# Patient Record
Sex: Male | Born: 1970 | Race: Black or African American | Hispanic: No | Smoking: Current some day smoker
Health system: Southern US, Community
[De-identification: ages and names within clinical notes are randomized; demographics above are authoritative.]

## PROBLEM LIST (undated history)

## (undated) HISTORY — PX: OTHER SURGICAL HISTORY: SHX169

---

## 2005-03-20 ENCOUNTER — Emergency Department (HOSPITAL_COMMUNITY): Admission: EM | Admit: 2005-03-20 | Discharge: 2005-03-20 | Payer: Self-pay | Admitting: Emergency Medicine

## 2007-05-15 IMAGING — CR DG HAND COMPLETE 3+V*R*
3 series · 3 of 3 positions shown · non-contrast
Comparison: none

CLINICAL DATA: Swelling of the medial hand.
 RIGHT HAND ? 3 VIEW ? 03/20/05: 
 No prior studies.

[view not recorded (1 of 3)]
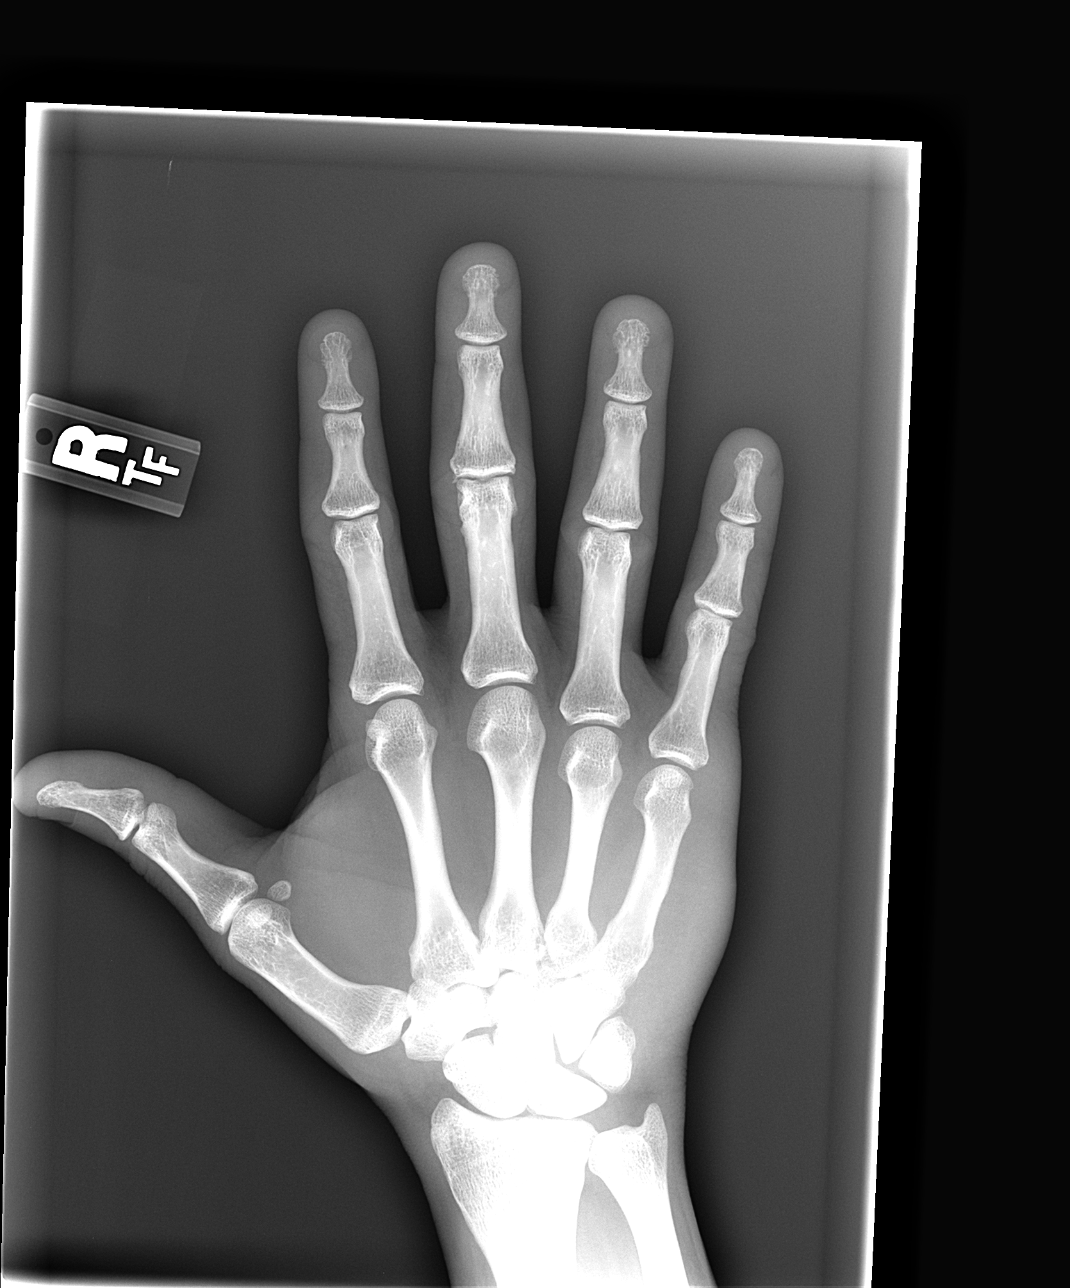

[view not recorded (2 of 3)]
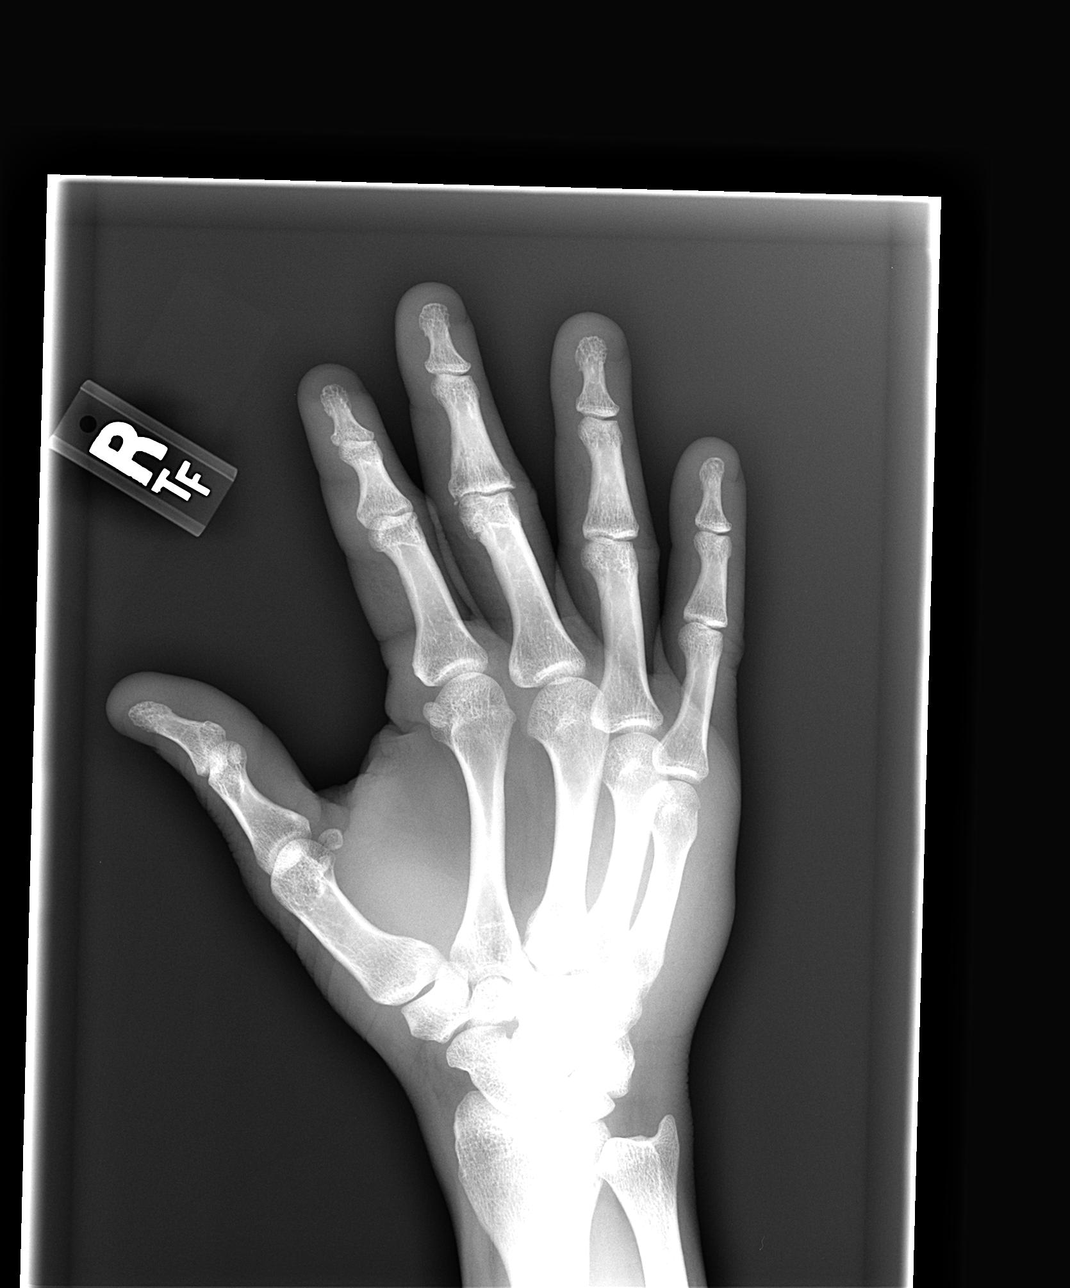

[view not recorded (3 of 3)]
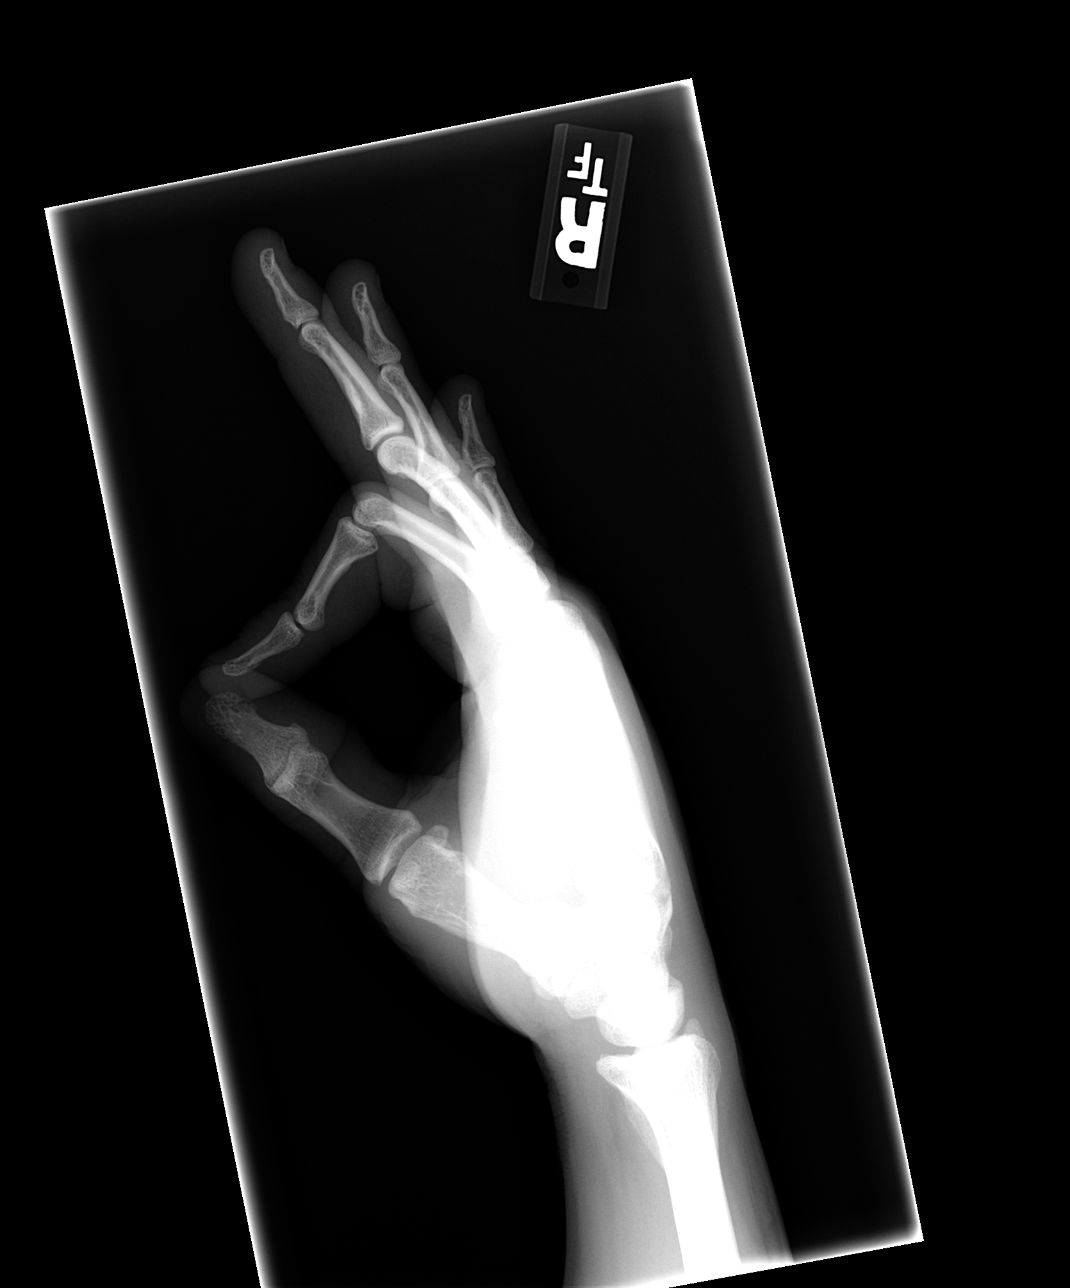

[3 of 3 positions shown; findings below may reference images not displayed]

FINDINGS: There is some degenerative arthropathy of the third proximal interphalangeal joint which may be due to old injury.  Osseous structures appear otherwise unremarkable.   There is some mild medial soft tissue swelling, just medial to the fifth metacarpal, without visible foreign body.
IMPRESSION: 1.  Mild degenerative arthropathy of the third proximal interphalangeal joint.
 2.    Soft tissue swelling medially.  No visible foreign body.

## 2011-04-25 ENCOUNTER — Emergency Department (HOSPITAL_COMMUNITY)
Admission: EM | Admit: 2011-04-25 | Discharge: 2011-04-25 | Discharge status: Against Medical Advice | Payer: Self-pay | Attending: Emergency Medicine | Admitting: Emergency Medicine

## 2011-04-25 DIAGNOSIS — Z5320 Procedure and treatment not carried out because of patient's decision for unspecified reasons: Secondary | ICD-10-CM | POA: Insufficient documentation

## 2011-04-25 DIAGNOSIS — H9210 Otorrhea, unspecified ear: Secondary | ICD-10-CM | POA: Insufficient documentation

## 2011-04-25 NOTE — ED Notes (Signed)
Called for triage not in waiting room  

## 2011-04-30 ENCOUNTER — Emergency Department (HOSPITAL_COMMUNITY)
Admission: EM | Admit: 2011-04-30 | Discharge: 2011-04-30 | Disposition: A | Discharge status: Home / Self Care | Payer: Self-pay | Attending: Emergency Medicine | Admitting: Emergency Medicine

## 2011-04-30 ENCOUNTER — Encounter: Payer: Self-pay | Admitting: *Deleted

## 2011-04-30 DIAGNOSIS — H6690 Otitis media, unspecified, unspecified ear: Secondary | ICD-10-CM | POA: Insufficient documentation

## 2011-04-30 DIAGNOSIS — H902 Conductive hearing loss, unspecified: Secondary | ICD-10-CM | POA: Insufficient documentation

## 2011-04-30 DIAGNOSIS — H6692 Otitis media, unspecified, left ear: Secondary | ICD-10-CM

## 2011-04-30 DIAGNOSIS — F17200 Nicotine dependence, unspecified, uncomplicated: Secondary | ICD-10-CM | POA: Insufficient documentation

## 2011-04-30 DIAGNOSIS — H6590 Unspecified nonsuppurative otitis media, unspecified ear: Secondary | ICD-10-CM | POA: Insufficient documentation

## 2011-04-30 MED ORDER — IBUPROFEN 800 MG PO TABS
800.0000 mg | ORAL_TABLET | Freq: Once | ORAL | Status: AC
  Administered 2011-04-30: 800 mg via ORAL
  Filled 2011-04-30: qty 1

## 2011-04-30 MED ORDER — AMOXICILLIN 250 MG PO CAPS
500.0000 mg | ORAL_CAPSULE | Freq: Once | ORAL | Status: AC
  Administered 2011-04-30: 500 mg via ORAL
  Filled 2011-04-30: qty 2

## 2011-04-30 MED ORDER — AMOXICILLIN 500 MG PO CAPS: 500.0000 mg | ORAL_CAPSULE | Freq: Three times a day (TID) | ORAL | Status: AC

## 2011-04-30 NOTE — ED Provider Notes (Signed)
History     CSN: [id number] Arrival date & time: 04/30/2011 10:26 PM   First MD Initiated Contact with Patient 04/30/11 2259      Chief Complaint  Patient presents with  . Otalgia    (Consider location/radiation/quality/duration/timing/severity/associated sxs/prior treatment) HPI Comments: Pt was having a pressure-sensation in his L ear 2 days ago.  He used a q-tip to clean it and "went a little too deep".  Initially he had pain and mild bleeding but that has subsided.  He still notes loss of hearing and ocassional drainage from the L ear.  Patient is a 40 y.o. male presenting with ear pain. The history is provided by the patient. No language interpreter was used.  Otalgia This is a new problem. Episode onset: 2 days ago. There is pain in the left ear. The problem occurs constantly. The problem has not changed since onset.There has been no fever. The pain is moderate. Associated symptoms include ear discharge and hearing loss.    History reviewed. No pertinent past medical history.  Past Surgical History  Procedure Date  . Cyst removed from jaw     History reviewed. No pertinent family history.  History  Substance Use Topics  . Smoking status: Current Everyday Smoker  . Smokeless tobacco: Not on file  . Alcohol Use: No      Review of Systems  Constitutional: Negative for fever and chills.  HENT: Positive for hearing loss, ear pain and ear discharge.   All other systems reviewed and are negative.    Allergies  Review of patient's allergies indicates no known allergies.  Home Medications   Current Outpatient Rx  Name Route Sig Dispense Refill  . IBUPROFEN 200 MG PO TABS Oral Take 200 mg by mouth every 6 (six) hours as needed.      Marlin Canary HEADACHE PO Oral Take 1 packet by mouth as needed. FOR PAIN     . GREEN TEA PO Oral Take 1 tablet by mouth daily.        BP 122/79  Pulse 67  Temp(Src) 98.3 F (36.8 C) (Oral)  Resp 20  Ht 5\' 7"  (1.702 m)  Wt 171 lb  (77.565 kg)  BMI 26.78 kg/m2  SpO2 99%  Physical Exam  Nursing note and vitals reviewed. Constitutional: He is oriented to person, place, and time. He appears well-developed and well-nourished.  HENT:  Head: Normocephalic and atraumatic.  Right Ear: Hearing, tympanic membrane, external ear and ear canal normal.  Left Ear: Ear canal normal. There is drainage and tenderness. Tympanic membrane is injected, perforated and bulging. A middle ear effusion is present. Decreased hearing is noted.  Nose: Nose normal.  Mouth/Throat: No oropharyngeal exudate.       Although i did not visualize  The TM perforation, his exam is consistent with the dx.  The TM is red along the superior/posterior aspect.  It is bulging and there obvious are fluid levels behind it.  Also the purulent material drains out of the ear after periods of dependence.  Eyes: EOM are normal.  Neck: Normal range of motion.  Cardiovascular: Normal rate, regular rhythm, normal heart sounds and intact distal pulses.   Pulmonary/Chest: Effort normal and breath sounds normal. No respiratory distress.  Abdominal: Soft. He exhibits no distension. There is no tenderness.  Musculoskeletal: Normal range of motion.  Neurological: He is alert and oriented to person, place, and time.  Skin: Skin is warm and dry.  Psychiatric: He has a normal mood and affect.  Judgment normal.    ED Course  Procedures (including critical care time)  Labs Reviewed - No data to display No results found.   No diagnosis found.    MDM          Worthy Rancher, PA 04/30/11 [phone]

## 2011-04-30 NOTE — ED Notes (Signed)
Pt c/o left ear pain. Pt states he was cleaning ear with a q-tip the other day and now c/o "fluid" coming out of ear.

## 2011-05-01 NOTE — ED Provider Notes (Signed)
Medical screening examination/treatment/procedure(s) were performed by non-physician practitioner and as supervising physician I was immediately available for consultation/collaboration.  Donnetta Hutching, MD 05/01/11 [phone]

## 2013-10-14 ENCOUNTER — Ambulatory Visit: Payer: Self-pay | Admitting: Family Medicine

## 2013-10-14 VITALS — BP 118/68 | HR 78 | Temp 98.3°F | Resp 14 | Ht 65.5 in | Wt 175.0 lb

## 2013-10-14 DIAGNOSIS — Z0000 Encounter for general adult medical examination without abnormal findings: Secondary | ICD-10-CM

## 2013-10-14 DIAGNOSIS — Z0289 Encounter for other administrative examinations: Secondary | ICD-10-CM

## 2013-10-14 NOTE — Progress Notes (Signed)
[facility] [address], [place] [id number] [phone]  Date:  10/14/2013   Name:  Carlos MoorsDarrick Robinson   DOB:  04/15/1971   MRN:  [id number]  PCP:  No primary provider on file.    Chief Complaint: Annual Exam   History of Present Illness:  Carlos Robinson is a 43 y.o. very pleasant male patient who presents with the following:  Here today for a DOT exam.  He is generally healthy.  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    There are no active problems to display for this patient.   History reviewed. No pertinent past medical history.  Past Surgical History  Procedure Laterality Date  . Cyst removed from jaw      History  Substance Use Topics  . Smoking status: Current Every Day Smoker  . Smokeless tobacco: Not on file  . Alcohol Use: No    Family History  Problem Relation Age of Onset  . Cancer Mother     No Known Allergies  Medication list has been reviewed and updated.  Current Outpatient Prescriptions on File Prior to Visit  Medication Sig Dispense Refill  . Aspirin-Acetaminophen-Caffeine (GOODY HEADACHE PO) Take 1 packet by mouth as needed. FOR PAIN       . Green Tea, Camillia sinensis, (GREEN TEA PO) Take 1 tablet by mouth daily.        Marland Kitchen. ibuprofen (ADVIL,MOTRIN) 200 MG tablet Take 200 mg by mouth every 6 (six) hours as needed.         No current facility-administered medications on file prior to visit.    Review of Systems:  As per HPI- otherwise negative.   Physical Examination: Filed Vitals:   10/14/13 1609  BP: 118/68  Pulse: 78  Temp: 98.3 F (36.8 C)  Resp: 14   Filed Vitals:   10/14/13 1609  Height: 5' 5.5" (1.664 m)  Weight: 175 lb (79.379 kg)   Body mass index is 28.67 kg/(m^2). Ideal Body Weight: Weight in (lb) to have BMI = 25: 152.2  GEN: WDWN, NAD, Non-toxic, A & O x 3, healthy appearing  HEENT: Atraumatic, Normocephalic. Neck supple. No masses, No LAD.  Bilateral  TM wnl, oropharynx normal.  PEERL,EOMI.   Ears and Nose: No external deformity. CV: RRR, No M/G/R. No JVD. No thrill. No extra heart sounds. PULM: CTA B, no wheezes, crackles, rhonchi. No retractions. No resp. distress. No accessory muscle use. ABD: S, NT, ND, +BS. No rebound. No HSM. EXTR: No c/c/e NEURO Normal gait.  PSYCH: Normally interactive. Conversant. Not depressed or anxious appearing.  Calm demeanor.  No inguinal hernia Normal strength, sensation and DTR all extremities.    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   He has started smoking a bit over the last 2 years- encouraged him to stop and he plans to do so   Signed Abbe AmsterdamJessica Lakina Mcintire, MD

## 2019-11-07 ENCOUNTER — Telehealth: Payer: Self-pay | Admitting: Orthopedic Surgery

## 2019-11-07 NOTE — Telephone Encounter (Signed)
Patient called to inquire about appointment with Dr Romeo Apple on Monday for a possible achilles problem - relayed office closed due to holiday. Offered the next appointment available as well as availability with Dr Hilda Lias. York Spaniel will try urgent care and will call back if needs to schedule.

## 2019-11-09 ENCOUNTER — Other Ambulatory Visit: Payer: Self-pay

## 2019-11-09 ENCOUNTER — Ambulatory Visit
Admission: EM | Admit: 2019-11-09 | Discharge: 2019-11-09 | Disposition: A | Discharge status: Home / Self Care | Payer: BC Managed Care – PPO | Attending: Emergency Medicine | Admitting: Emergency Medicine

## 2019-11-09 ENCOUNTER — Ambulatory Visit (INDEPENDENT_AMBULATORY_CARE_PROVIDER_SITE_OTHER): Payer: BC Managed Care – PPO

## 2019-11-09 DIAGNOSIS — S99911D Unspecified injury of right ankle, subsequent encounter: Secondary | ICD-10-CM

## 2019-11-09 DIAGNOSIS — M25471 Effusion, right ankle: Secondary | ICD-10-CM

## 2019-11-09 DIAGNOSIS — S99911A Unspecified injury of right ankle, initial encounter: Secondary | ICD-10-CM

## 2019-11-09 DIAGNOSIS — M25571 Pain in right ankle and joints of right foot: Secondary | ICD-10-CM | POA: Diagnosis not present

## 2019-11-09 DIAGNOSIS — M7989 Other specified soft tissue disorders: Secondary | ICD-10-CM | POA: Diagnosis not present

## 2019-11-09 MED ORDER — MELOXICAM 15 MG PO TABS: 15.0000 mg | ORAL_TABLET | Freq: Every day | ORAL | 0 refills | Status: DC

## 2019-11-09 NOTE — ED Triage Notes (Signed)
Pt presents with right foot pain for past month. Pt was playing basketball and may have torn achilles tendon, swelling noted

## 2019-11-09 NOTE — ED Provider Notes (Signed)
[facility]   [id number] 11/09/19 Arrival Time: 0845  CC: LT ankle PAIN  SUBJECTIVE: History from: patient. Kyrese Laswell is a 49 y.o. male complains of RT ankle pain and injury x 1 month.  Symptoms began while running during basketball.  Localizes the pain to the back of ankle.  Describes the pain as intermittent and sharp in character.  Has tried icing with minimal relief.  Symptoms are made worse with walking and weight-bearing intermittent.  Denies similar symptoms in the past.  Complains of associated swelling and numbness/ tingling.  Denies fever, chills, erythema, ecchymosis, weakness.  ROS: As per HPI.  All other pertinent ROS negative.     History reviewed. No pertinent past medical history. Past Surgical History:  Procedure Laterality Date  . cyst removed from jaw     No Known Allergies No current facility-administered medications on file prior to encounter.   Current Outpatient Medications on File Prior to Encounter  Medication Sig Dispense Refill  . Aspirin-Acetaminophen-Caffeine (GOODY HEADACHE PO) Take 1 packet by mouth as needed. FOR PAIN     . Green Tea, Camillia sinensis, (GREEN TEA PO) Take 1 tablet by mouth daily.       Social History   Socioeconomic History  . Marital status: Single    Spouse name: Not on file  . Number of children: Not on file  . Years of education: Not on file  . Highest education level: Not on file  Occupational History  . Not on file  Tobacco Use  . Smoking status: Current Every Day Smoker  . Smokeless tobacco: Never Used  Substance and Sexual Activity  . Alcohol use: No  . Drug use: No  . Sexual activity: Not on file  Other Topics Concern  . Not on file  Social History Narrative  . Not on file   Social Determinants of Health   Financial Resource Strain:   . Difficulty of Paying Living Expenses:   Food Insecurity:   . Worried About Programme researcher, broadcasting/film/video in the Last Year:   . Barista in the Last Year:     Transportation Needs:   . Freight forwarder (Medical):   Marland Kitchen Lack of Transportation (Non-Medical):   Physical Activity:   . Days of Exercise per Week:   . Minutes of Exercise per Session:   Stress:   . Feeling of Stress :   Social Connections:   . Frequency of Communication with Friends and Family:   . Frequency of Social Gatherings with Friends and Family:   . Attends Religious Services:   . Active Member of Clubs or Organizations:   . Attends Banker Meetings:   Marland Kitchen Marital Status:   Intimate Partner Violence:   . Fear of Current or Ex-Partner:   . Emotionally Abused:   Marland Kitchen Physically Abused:   . Sexually Abused:    Family History  Problem Relation Age of Onset  . Cancer Mother     OBJECTIVE:  Vitals:   11/09/19 0924  BP: 135/70  Pulse: 99  Resp: 18  Temp: 98.7 F (37.1 C)  SpO2: 96%    General appearance: ALERT; in no acute distress.  Head: NCAT Lungs: Normal respiratory effort CV: Dorsalis pedis pulse 2+ Musculoskeletal: RT ankle  Inspection: Significant swelling diffuse about the ankle Palpation: TTP over distal fibula and achilles ROM: FROM active and passive Strength: 5/5 dorsiflexion, 5/5 plantar flexion Skin: warm and dry Neurologic: Ambulates with minimal difficulty; Sensation intact about  the upper/ lower extremities Psychological: alert and cooperative; normal mood and affect  DIAGNOSTIC STUDIES:  DG Ankle Complete Right  Result Date: 11/09/2019 CLINICAL DATA:  Ankle injury 1 month ago, initial encounter EXAM: RIGHT ANKLE - COMPLETE 3+ VIEW COMPARISON:  None. FINDINGS: Soft tissue swelling is noted about the ankle joint. No acute fracture or dislocation is noted. Tarsal degenerative changes are seen. Mild irregularity and soft tissue swelling is noted along the Achilles tendon. Correlate with clinical exam. IMPRESSION: Soft tissue swelling with irregularity along the Achilles tendon. This may represent a partial Achilles tear. No  fracture is seen. Electronically Signed   By: Inez Catalina M.D.   On: 11/09/2019 10:01    X-rays negative for bony abnormalities including fracture, or dislocation.   I have reviewed the x-rays myself and the radiologist interpretation. I am in agreement with the radiologist interpretation.     ASSESSMENT & PLAN:  1. Pain in joint involving right ankle and foot   2. Injury of right ankle, initial encounter   3. Right ankle swelling     Meds ordered this encounter  Medications  . meloxicam (MOBIC) 15 MG tablet    Sig: Take 1 tablet (15 mg total) by mouth daily.    Dispense:  30 tablet    Refill:  0    Order Specific Question:   Supervising Provider    Answer:   Raylene Everts [[id number]]   X-rays negative for fracture or dislocation, but concerning for partial achilles tendon tear Continue conservative management of rest, ice, and elevation Limit painful activities Take mobic as needed for pain relief (may cause abdominal discomfort, ulcers, and GI bleeds avoid taking with other NSAIDs) Follow up with orthopedist for further evaluation and management Return or go to the ER if you have any new or worsening symptoms (fever, chills, chest pain, redness, bruising, worsening swelling, worsening symptoms, etc...)    Reviewed expectations re: course of current medical issues. Questions answered. Outlined signs and symptoms indicating need for more acute intervention. Patient verbalized understanding. After Visit Summary given.    Lestine Box, PA-C 11/09/19 1016

## 2019-11-09 NOTE — Discharge Instructions (Signed)
X-rays negative for fracture or dislocation, but concerning for partial achilles tendon tear Continue conservative management of rest, ice, and elevation Limit painful activities Take mobic as needed for pain relief (may cause abdominal discomfort, ulcers, and GI bleeds avoid taking with other NSAIDs) Follow up with orthopedist for further evaluation and management Return or go to the ER if you have any new or worsening symptoms (fever, chills, chest pain, redness, bruising, worsening swelling, worsening symptoms, etc...)

## 2019-11-18 ENCOUNTER — Other Ambulatory Visit: Payer: Self-pay

## 2019-11-18 ENCOUNTER — Encounter: Payer: Self-pay | Admitting: Orthopedic Surgery

## 2019-11-18 ENCOUNTER — Telehealth: Payer: Self-pay | Admitting: Orthopedic Surgery

## 2019-11-18 ENCOUNTER — Ambulatory Visit (INDEPENDENT_AMBULATORY_CARE_PROVIDER_SITE_OTHER): Payer: BC Managed Care – PPO | Admitting: Orthopedic Surgery

## 2019-11-18 VITALS — BP 113/74 | HR 77 | Ht 67.0 in | Wt 181.0 lb

## 2019-11-18 DIAGNOSIS — S86011A Strain of right Achilles tendon, initial encounter: Secondary | ICD-10-CM | POA: Diagnosis not present

## 2019-11-18 NOTE — Progress Notes (Signed)
EMERGENCY ROOM FOLLOW UP  NEW PROBLEM/PATIENT   Patient ID: Sederick Jacobsen, male   DOB: [birth year], 49 y.o.   MRN: [id number]  Emergency room record from (date) 5/29 has been reviewed and this is included by reference and includes the review of systems with the following addition:   Chief Complaint  Patient presents with  . Ankle Pain    R/hurt it playing basketball 10/12/19/better but still having issues    HPI Jasson Meinzer is a 49 y.o. male.  Right foot/ankle injury  HPI 49 year old male playing basketball back on May 1 ran past another player felt like he was kicked in the back of the ankle was unable to continue playing in the game.  He has been limping for a period of about 5 weeks with associated swelling of the right foot and ankle and pain in the Achilles  Notes previous strain of the Achilles Review of Systems Review of Systems  All other systems reviewed and are negative.    has no past medical history on file.   Past Surgical History:  Procedure Laterality Date  . cyst removed from jaw      Family History  Problem Relation Age of Onset  . Cancer Mother     Social History Social History   Tobacco Use  . Smoking status: Current Every Day Smoker  . Smokeless tobacco: Never Used  Substance Use Topics  . Alcohol use: No  . Drug use: No    No Known Allergies  Current Outpatient Medications  Medication Sig Dispense Refill  . Aspirin-Acetaminophen-Caffeine (GOODY HEADACHE PO) Take 1 packet by mouth as needed. FOR PAIN     . meloxicam (MOBIC) 15 MG tablet Take 1 tablet (15 mg total) by mouth daily. 30 tablet 0  . Green Tea, Camillia sinensis, (GREEN TEA PO) Take 1 tablet by mouth daily.       No current facility-administered medications for this visit.    Physical Exam BP 113/74   Pulse 77   Ht 5\' 7"  (1.702 m)   Wt 181 lb (82.1 kg)   BMI 28.35 kg/m  Body mass index is 28.35 kg/m.  Well developed and well nourished  Abnormal gait limping left  leg Alert and oriented x 3  Normal affect and mood  Ortho Exam  Palpable defect in the Achilles 2 cm from the insertion positive Thompson test for Achilles tendon rupture   Normal ankle range of motion   Plantarflexion strength   Able ankle joint    Normal skin and neurovascular exam   data Reviewed IMAGING From THE ER AND THE REPORT ARE REVIEWED, MY INTERPRETATION OF THE IMAGE(S) IS :  Soft tissue swelling no fracture no bone fragments   Assessment  Subacute Achilles tendon rupture  Plan   Recommend repair Achilles right ankle  The procedure has been fully reviewed with the patient; The risks and benefits of surgery have been discussed and explained and understood. Alternative treatment has also been reviewed, questions were encouraged and answered. The postoperative plan is also been reviewed.    , MD 11/18/2019 10:48 AM

## 2019-11-18 NOTE — Patient Instructions (Addendum)
Smoking Tobacco Information, Adult Smoking tobacco can be harmful to your health. Tobacco contains a poisonous (toxic), colorless chemical called nicotine. Nicotine is addictive. It changes the brain and can make it hard to stop smoking. Tobacco also has other toxic chemicals that can hurt your body and raise your risk of many cancers. How can smoking tobacco affect me? Smoking tobacco puts you at risk for:  Cancer. Smoking is most commonly associated with lung cancer, but can also lead to cancer in other parts of the body.  Chronic obstructive pulmonary disease (COPD). This is a long-term lung condition that makes it hard to breathe. It also gets worse over time.  High blood pressure (hypertension), heart disease, stroke, or heart attack.  Lung infections, such as pneumonia.  Cataracts. This is when the lenses in the eyes become clouded.  Digestive problems. This may include peptic ulcers, heartburn, and gastroesophageal reflux disease (GERD).  Oral health problems, such as gum disease and tooth loss.  Loss of taste and smell. Smoking can affect your appearance by causing:  Wrinkles.  Yellow or stained teeth, fingers, and fingernails. Smoking tobacco can also affect your social life, because:  It may be challenging to find places to smoke when away from home. Many workplaces, restaurants, hotels, and public places are tobacco-free.  Smoking is expensive. This is due to the cost of tobacco and the long-term costs of treating health problems from smoking.  Secondhand smoke may affect those around you. Secondhand smoke can cause lung cancer, breathing problems, and heart disease. Children of smokers have a higher risk for: ? Sudden infant death syndrome (SIDS). ? Ear infections. ? Lung infections. If you currently smoke tobacco, quitting now can help you:  Lead a longer and healthier life.  Look, smell, breathe, and feel better over time.  Save money.  Protect others from the  harms of secondhand smoke. What actions can I take to prevent health problems? Quit smoking   Do not start smoking. Quit if you already do.  Make a plan to quit smoking and commit to it. Look for programs to help you and ask your health care provider for recommendations and ideas.  Set a date and write down all the reasons you want to quit.  Let your friends and family know you are quitting so they can help and support you. Consider finding friends who also want to quit. It can be easier to quit with someone else, so that you can support each other.  Talk with your health care provider about using nicotine replacement medicines to help you quit, such as gum, lozenges, patches, sprays, or pills.  Do not replace cigarette smoking with electronic cigarettes, which are commonly called e-cigarettes. The safety of e-cigarettes is not known, and some may contain harmful chemicals.  If you try to quit but return to smoking, stay positive. It is common to slip up when you first quit, so take it one day at a time.  Be prepared for cravings. When you feel the urge to smoke, chew gum or suck on hard candy. Lifestyle  Stay busy and take care of your body.  Drink enough fluid to keep your urine pale yellow.  Get plenty of exercise and eat a healthy diet. This can help prevent weight gain after quitting.  Monitor your eating habits. Quitting smoking can cause you to have a larger appetite than when you smoke.  Find ways to relax. Go out with friends or family to a movie or a restaurant   where people do not smoke.  Ask your health care provider about having regular tests (screenings) to check for cancer. This may include blood tests, imaging tests, and other tests.  Find ways to manage your stress, such as meditation, yoga, or exercise. Where to find support To get support to quit smoking, consider:  Asking your health care provider for more information and resources.  Taking classes to learn  more about quitting smoking.  Looking for local organizations that offer resources about quitting smoking.  Joining a support group for people who want to quit smoking in your local community.  Calling the smokefree.gov counselor helpline: 1-800-Quit-Now 425-024-3385) Where to find more information You may find more information about quitting smoking from:  HelpGuide.org: www.helpguide.org  https://hall.com/: smokefree.gov  American Lung Association: www.lung.org Contact a health care provider if you:  Have problems breathing.  Notice that your lips, nose, or fingers turn blue.  Have chest pain.  Are coughing up blood.  Feel faint or you pass out.  Have other health changes that cause you to worry. Summary  Smoking tobacco can negatively affect your health, the health of those around you, your finances, and your social life.  Do not start smoking. Quit if you already do. If you need help quitting, ask your health care provider.  Think about joining a support group for people who want to quit smoking in your local community. There are many effective programs that will help you to quit this behavior. This information is not intended to replace advice given to you by your health care provider. Make sure you discuss any questions you have with your health care provider. Document Revised: 02/22/2019 Document Reviewed: 06/14/2016 Elsevier Patient Education  [place].  Achilles Tendon Tear  The Achilles tendon is a cord-like band that connects the muscles of your lower leg (calf) to your heel. The Achilles tendon is the most common site of tendon tearing (rupture). What are the causes? This condition may be caused by:  Stress from a sudden stretching of the tendon. For example, this may occur when you land from a jump or when your heel drops down into a hole on uneven ground.  A hard, direct hit to the tendon.  Pushing off your foot forcefully, such as when  sprinting, jumping, or changing direction while running. What increases the risk? You are more likely to develop this condition if you:  Are a runner.  Play sports that involve sprinting, running, or jumping.  Play contact sports.  Have a weak Achilles tendon. Tendons can weaken from aging, repeat injuries, and chronic tendinitis.  Are male.  Are 53-28 years of age.  Take a type of antibiotic medicine called quinolones. What are the signs or symptoms? Symptoms of this condition include:  Hearing a noise, like a pop or a snap, at the time of injury.  Severe, sudden pain in the back of the ankle.  Swelling and bruising.  Inability to actively point your toes down.  Pain when standing or walking.  A feeling of giving way when you step on the affected foot. How is this diagnosed? This condition is usually diagnosed based on a physical exam. You may also have imaging tests, such as:  Ultrasound.  X-rays.  MRI. How is this treated? This condition may be treated with:  Rest.  Ice applied to the area.  Pain medicine.  Crutches.  A cast, splint, or other device to keep the ankle from moving (keep it immobilized).  Heel  wedges to reduce the stretch on your tendon as it heals.  Surgery. This option may depend on your age and your activity level. Follow these instructions at home: Medicines  Take over-the-counter and prescription medicines only as told by your health care provider.  Ask your health care provider if the medicine prescribed to you: ? Requires you to avoid driving or using heavy machinery. ? Can cause constipation. You may need to take these actions to prevent or treat constipation:  Drink enough fluid to keep your urine pale yellow.  Take over-the-counter or prescription medicines.  Eat foods that are high in fiber, such as beans, whole grains, and fresh fruits and vegetables.  Limit foods that are high in fat and processed sugars, such as  fried or sweet foods. If you have a cast:  Do not stick anything inside the cast to scratch your skin. Doing that increases your risk of infection.  You may put lotion on dry skin around the edges of the cast. Do not put lotion on the skin underneath it. If you have a splint or brace:  Wear it as told by your health care provider. Remove it only as told by your health care provider.  Loosen it if your toes tingle, become numb, or turn cold and blue. If you have a cast, splint, or brace:  Keep it clean and dry.  Check the skin around it every day. Tell your health care provider about any concerns.  Ask your health care provider when it is safe to drive if you have it on a leg or foot that you use for driving. Bathing  Do not take baths, swim, or use a hot tub until your health care provider approves. Ask your health care provider if you may take showers. You may only be allowed to take sponge baths.  If the cast, splint, or brace is not waterproof: ? Do not let it get wet. ? Cover it with a watertight covering when you take a bath or shower. Managing pain, stiffness, and swelling   If directed, put ice on the injured area. ? If you have a removable splint or brace, remove it as told by your health care provider. ? Put ice in a plastic bag. ? Place a towel between your skin and the bag or between your cast and the bag. ? Leave the ice on for 20 minutes, 2-3 times a day.  Move your toes often to avoid stiffness and to lessen swelling.  Raise (elevate) the injured area above the level of your heart while you are sitting or lying down. Activity  Return to your normal activities as told by your health care provider. Ask your health care provider what activities are safe for you.  Do not use the injured leg to support your body weight until your health care provider says that you can. Use crutches as told by your health care provider.  Ask your health care provider which exercises  are safe for you. General instructions  Do not put pressure on any part of a cast or splint until it is fully hardened. This may take several hours.  Do not use any products that contain nicotine or tobacco, such as cigarettes, e-cigarettes, and chewing tobacco. These can delay healing. If you need help quitting, ask your health care provider.  Use heel wedges if told by your health care provider.  Keep all follow-up visits as told by your health care provider. This is important. How is this  prevented?  Do exercises exactly as told by your health care provider and adjust them as directed.  Warm up and stretch before being active.  Cool down and stretch after being active.  Rest between periods of activity.  Use equipment that fits you. Contact a health care provider if you have:  More pain and swelling.  Pain that is not controlled with medicines.  New symptoms.  Symptoms that get worse.  Problems moving your toes or foot.  Warmth in your foot.  A fever. Summary  An Achilles tendon tear is an injury that involves a tear in this tendon.  This injury typically occurs in runners or athletes who play sports that involve sprinting, running, or jumping.  An Achilles tendon tear causes sudden severe pain in your ankle and an inability to point your foot down.  Treatment for this injury may include rest, ice, and pain medicines. In some cases, surgery may be needed. This information is not intended to replace advice given to you by your health care provider. Make sure you discuss any questions you have with your health care provider. Document Revised: 03/29/2018 Document Reviewed: 12/21/2017 Elsevier Patient Education  2020 [employer].

## 2019-11-18 NOTE — Telephone Encounter (Signed)
Patient called (on line now) asking if the "first date" discussed is still available other than the "change date" of 11/28/19. Please advise. Also has question as to whether he should go to work on Wednesday, 11/20/19, which is his next scheduled day?

## 2019-11-19 ENCOUNTER — Encounter: Payer: Self-pay | Admitting: Orthopedic Surgery

## 2019-11-19 NOTE — Patient Instructions (Signed)
Carlos Robinson  11/19/2019     @PREFPERIOPPHARMACY @   Your procedure is scheduled on  11/21/2019 .  [facility] at  1045  A.M.  Call this number if you have problems the morning of surgery:  [phone]   Remember:  Do not eat or drink after midnight.                         Take these medicines the morning of surgery with A SIP OF WATER  Mobic(if needed).    Do not wear jewelry, make-up or nail polish.  Do not wear lotions, powders, or perfumes. Please wear deodorant and brush your teeth.  Do not shave 48 hours prior to surgery.  Men may shave face and neck.  Do not bring valuables to the hospital.  [facility] is not responsible for any belongings or valuables.  Contacts, dentures or bridgework may not be worn into surgery.  Leave your suitcase in the car.  After surgery it may be brought to your room.  For patients admitted to the hospital, discharge time will be determined by your treatment team.  Patients discharged the day of surgery will not be allowed to drive home.   Name and phone number of your driver:   family Special instructions: DO NOT smoke the morning of your procedure.  Please read over the following fact sheets that you were given. Anesthesia Post-op Instructions and Care and Recovery After Surgery       Achilles Tendon Tear Repair, Care After This sheet gives you information about how to care for yourself after your procedure. Your health care provider may also give you more specific instructions. If you have problems or questions, contact your health care provider. What can I expect after the procedure? After the procedure, it is common to have:  Numbness in your foot. This should go away within 24 hours.  Pain. It may take 6 months before you can return to your regular activity level. However, complete recovery can take a year or longer. Follow these instructions at home: Medicines  Take over-the-counter and prescription  medicines only as told by your health care provider.  If you were prescribed an antibiotic medicine, take it as told by your health care provider. Do not stop taking the antibiotic even if your condition improves.  Ask your health care provider if the medicine prescribed to you: ? Requires you to avoid driving or using heavy machinery. ? Can cause constipation. You may need to take these actions to prevent or treat constipation:  Drink enough fluid to keep your urine pale yellow.  Take over-the-counter or prescription medicines.  Eat foods that are high in fiber, such as beans, whole grains, and fresh fruits and vegetables.  Limit foods that are high in fat and processed sugars, such as fried and sweet foods. If you have a splint or boot:  Wear the splint or boot as told by your health care provider. Remove it only as told by your health care provider.  Loosen it if your toes tingle, become numb, or turn cold and blue.  Keep it clean and dry. If you have a cast:  Do not put pressure on any part of the cast until it is fully hardened. This may take several hours.  Do not stick anything inside the cast to scratch your skin. Doing that increases your risk of infection.  Check the skin  around the cast every day. Tell your health care provider about any concerns.  You may put lotion on dry skin around the edges of the cast. Do not put lotion on the skin underneath the cast.  Keep it clean and dry. Bathing  Do not take baths, swim, or use a hot tub until your health care provider approves. Ask your health care provider if you may take showers. You may only be allowed to take sponge baths.  If the splint, boot, or cast is not waterproof: ? Do not let it get wet. ? Cover it with a watertight covering when you take a bath or a shower. Incision care   Follow instructions from your health care provider about how to take care of your incision. Make sure you: ? Wash your hands with  soap and water before and after you change your bandage (dressing). If soap and water are not available, use hand sanitizer. ? Change your dressing as told by your health care provider. ? Leave stitches (sutures) or staples in place. These skin closures may need to stay in place for 2 weeks or longer.  If you have a removable splint or boot, check your incision area every day for signs of infection. Check for: ? More redness, swelling, or pain. ? Fluid or blood. ? Warmth. ? Pus or a bad smell. Managing pain, stiffness, and swelling   If directed, put ice on the injured area. To do this: ? If you have a removable splint or boot, remove it as told by your health care provider. ? Put ice in a plastic bag. ? Place a towel between your skin and the bag or between your cast and the bag. ? Leave the ice on for 20 minutes, 2-3 times a day.  Move your toes often to reduce stiffness and swelling.  Raise (elevate) your leg above the level of your heart while you are sitting or lying down. Activity  Do not walk on or put weight on your injured leg. Use crutches or another walking aid, such as a walking boot.  Follow instructions from your health care provider on how to move your ankle and how much weight you can put on your leg. If you have a cast, boot, or splint, you should receive these instructions after it is removed.  Follow your rehab plan. Do exercises as told by your health care provider or physical therapist.  Do not return to physical activity or sports until you are cleared by your health care provider or physical therapist. General instructions  Ask your health care provider when it is safe to drive if you have a splint, boot, or cast on your leg.  See a physical therapist if directed by your health care provider.  Do not use any products that contain nicotine or tobacco, such as cigarettes, e-cigarettes, and chewing tobacco. These can delay incision healing after surgery. If you  need help quitting, ask your health care provider.  Keep all follow-up visits as told by your health care provider. This is important. If you have a cast or splint, your health care provider will remove it about 2 weeks after surgery. If you have sutures, they will also be removed during this time. Contact a health care provider if:  You have chills.  You have any of these signs of infection: ? More redness, swelling, or pain around your incision. ? Fluid or blood coming from your incision. ? Warmth coming from your incision. ? Pus or  a bad smell coming from your incision. ? A fever.  Your pain medicine is not working.  You have persistent numbness, tingling, or burning in your foot or toes.  Your splint, boot, or cast feels too tight, even after loosening the splint or boot.  You cannot wiggle your toes.  You notice cracks or soft spots on your cast. Get help right away if:  You have severe swelling, pain, or numbness that is getting worse.  You are bleeding through your splint, boot, or cast.  You have chest pain.  You have trouble breathing. Summary  After your procedure, it is common to have pain and numbness in your foot. Numbness should go away within 24 hours after surgery.  If you have a splint or boot, loosen it if your toes tingle, become numb, or turn cold and blue. Keep the splint or boot clean and dry.  If you have a cast, do not put pressure on it until it hardens. To avoid infections, do not stick objects under the cast to scratch the skin and do not put lotion under the cast. Keep the cast clean and dry.  Follow instructions about caring for your incision, avoiding some activities, taking medicines, and keeping follow-up visits. This information is not intended to replace advice given to you by your health care provider. Make sure you discuss any questions you have with your health care provider. Document Revised: 11/07/2018 Document Reviewed:  11/07/2018 Elsevier Patient Education  2020 [employer]  General Anesthesia, Adult, Care After This sheet gives you information about how to care for yourself after your procedure. Your health care provider may also give you more specific instructions. If you have problems or questions, contact your health care provider. What can I expect after the procedure? After the procedure, the following side effects are common:  Pain or discomfort at the IV site.  Nausea.  Vomiting.  Sore throat.  Trouble concentrating.  Feeling cold or chills.  Weak or tired.  Sleepiness and fatigue.  Soreness and body aches. These side effects can affect parts of the body that were not involved in surgery. Follow these instructions at home:  For at least 24 hours after the procedure:  Have a responsible adult stay with you. It is important to have someone help care for you until you are awake and alert.  Rest as needed.  Do not: ? Participate in activities in which you could fall or become injured. ? Drive. ? Use heavy machinery. ? Drink alcohol. ? Take sleeping pills or medicines that cause drowsiness. ? Make important decisions or sign legal documents. ? Take care of children on your own. Eating and drinking  Follow any instructions from your health care provider about eating or drinking restrictions.  When you feel hungry, start by eating small amounts of foods that are soft and easy to digest (bland), such as toast. Gradually return to your regular diet.  Drink enough fluid to keep your urine pale yellow.  If you vomit, rehydrate by drinking water, juice, or clear broth. General instructions  If you have sleep apnea, surgery and certain medicines can increase your risk for breathing problems. Follow instructions from your health care provider about wearing your sleep device: ? Anytime you are sleeping, including during daytime naps. ? While taking prescription pain medicines,  sleeping medicines, or medicines that make you drowsy.  Return to your normal activities as told by your health care provider. Ask your health care provider what activities are  safe for you.  Take over-the-counter and prescription medicines only as told by your health care provider.  If you smoke, do not smoke without supervision.  Keep all follow-up visits as told by your health care provider. This is important. Contact a health care provider if:  You have nausea or vomiting that does not get better with medicine.  You cannot eat or drink without vomiting.  You have pain that does not get better with medicine.  You are unable to pass urine.  You develop a skin rash.  You have a fever.  You have redness around your IV site that gets worse. Get help right away if:  You have difficulty breathing.  You have chest pain.  You have blood in your urine or stool, or you vomit blood. Summary  After the procedure, it is common to have a sore throat or nausea. It is also common to feel tired.  Have a responsible adult stay with you for the first 24 hours after general anesthesia. It is important to have someone help care for you until you are awake and alert.  When you feel hungry, start by eating small amounts of foods that are soft and easy to digest (bland), such as toast. Gradually return to your regular diet.  Drink enough fluid to keep your urine pale yellow.  Return to your normal activities as told by your health care provider. Ask your health care provider what activities are safe for you. This information is not intended to replace advice given to you by your health care provider. Make sure you discuss any questions you have with your health care provider. Document Revised: 06/02/2017 Document Reviewed: 01/13/2017 Elsevier Patient Education  2020 [employer]. How to Use Chlorhexidine for Bathing Chlorhexidine gluconate (CHG) is a germ-killing (antiseptic) solution that  is used to clean the skin. It can get rid of the bacteria that normally live on the skin and can keep them away for about 24 hours. To clean your skin with CHG, you may be given:  A CHG solution to use in the shower or as part of a sponge bath.  A prepackaged cloth that contains CHG. Cleaning your skin with CHG may help lower the risk for infection:  While you are staying in the intensive care unit of the hospital.  If you have a vascular access, such as a central line, to provide short-term or long-term access to your veins.  If you have a catheter to drain urine from your bladder.  If you are on a ventilator. A ventilator is a machine that helps you breathe by moving air in and out of your lungs.  After surgery. What are the risks? Risks of using CHG include:  A skin reaction.  Hearing loss, if CHG gets in your ears.  Eye injury, if CHG gets in your eyes and is not rinsed out.  The CHG product catching fire. Make sure that you avoid smoking and flames after applying CHG to your skin. Do not use CHG:  If you have a chlorhexidine allergy or have previously reacted to chlorhexidine.  On babies younger than 67 months of age. How to use CHG solution  Use CHG only as told by your health care provider, and follow the instructions on the label.  Use the full amount of CHG as directed. Usually, this is one bottle. During a shower Follow these steps when using CHG solution during a shower (unless your health care provider gives you different instructions): 1.  Start the shower. 2. Use your normal soap and shampoo to wash your face and hair. 3. Turn off the shower or move out of the shower stream. 4. Pour the CHG onto a clean washcloth. Do not use any type of brush or rough-edged sponge. 5. Starting at your neck, lather your body down to your toes. Make sure you follow these instructions: ? If you will be having surgery, pay special attention to the part of your body where you will  be having surgery. Scrub this area for at least 1 minute. ? Do not use CHG on your head or face. If the solution gets into your ears or eyes, rinse them well with water. ? Avoid your genital area. ? Avoid any areas of skin that have broken skin, cuts, or scrapes. ? Scrub your back and under your arms. Make sure to wash skin folds. 6. Let the lather sit on your skin for 1-2 minutes or as long as told by your health care provider. 7. Thoroughly rinse your entire body in the shower. Make sure that all body creases and crevices are rinsed well. 8. Dry off with a clean towel. Do not put any substances on your body afterward--such as powder, lotion, or perfume--unless you are told to do so by your health care provider. Only use lotions that are recommended by the manufacturer. 9. Put on clean clothes or pajamas. 10. If it is the night before your surgery, sleep in clean sheets.  During a sponge bath Follow these steps when using CHG solution during a sponge bath (unless your health care provider gives you different instructions): 1. Use your normal soap and shampoo to wash your face and hair. 2. Pour the CHG onto a clean washcloth. 3. Starting at your neck, lather your body down to your toes. Make sure you follow these instructions: ? If you will be having surgery, pay special attention to the part of your body where you will be having surgery. Scrub this area for at least 1 minute. ? Do not use CHG on your head or face. If the solution gets into your ears or eyes, rinse them well with water. ? Avoid your genital area. ? Avoid any areas of skin that have broken skin, cuts, or scrapes. ? Scrub your back and under your arms. Make sure to wash skin folds. 4. Let the lather sit on your skin for 1-2 minutes or as long as told by your health care provider. 5. Using a different clean, wet washcloth, thoroughly rinse your entire body. Make sure that all body creases and crevices are rinsed well. 6. Dry off  with a clean towel. Do not put any substances on your body afterward--such as powder, lotion, or perfume--unless you are told to do so by your health care provider. Only use lotions that are recommended by the manufacturer. 7. Put on clean clothes or pajamas. 8. If it is the night before your surgery, sleep in clean sheets. How to use CHG prepackaged cloths  Only use CHG cloths as told by your health care provider, and follow the instructions on the label.  Use the CHG cloth on clean, dry skin.  Do not use the CHG cloth on your head or face unless your health care provider tells you to.  When washing with the CHG cloth: ? Avoid your genital area. ? Avoid any areas of skin that have broken skin, cuts, or scrapes. Before surgery Follow these steps when using a CHG cloth to clean  before surgery (unless your health care provider gives you different instructions): 1. Using the CHG cloth, vigorously scrub the part of your body where you will be having surgery. Scrub using a back-and-forth motion for 3 minutes. The area on your body should be completely wet with CHG when you are done scrubbing. 2. Do not rinse. Discard the cloth and let the area air-dry. Do not put any substances on the area afterward, such as powder, lotion, or perfume. 3. Put on clean clothes or pajamas. 4. If it is the night before your surgery, sleep in clean sheets.  For general bathing Follow these steps when using CHG cloths for general bathing (unless your health care provider gives you different instructions). 1. Use a separate CHG cloth for each area of your body. Make sure you wash between any folds of skin and between your fingers and toes. Wash your body in the following order, switching to a new cloth after each step: ? The front of your neck, shoulders, and chest. ? Both of your arms, under your arms, and your hands. ? Your stomach and groin area, avoiding the genitals. ? Your right leg and foot. ? Your left leg  and foot. ? The back of your neck, your back, and your buttocks. 2. Do not rinse. Discard the cloth and let the area air-dry. Do not put any substances on your body afterward--such as powder, lotion, or perfume--unless you are told to do so by your health care provider. Only use lotions that are recommended by the manufacturer. 3. Put on clean clothes or pajamas. Contact a health care provider if:  Your skin gets irritated after scrubbing.  You have questions about using your solution or cloth. Get help right away if:  Your eyes become very red or swollen.  Your eyes itch badly.  Your skin itches badly and is red or swollen.  Your hearing changes.  You have trouble seeing.  You have swelling or tingling in your mouth or throat.  You have trouble breathing.  You swallow any chlorhexidine. Summary  Chlorhexidine gluconate (CHG) is a germ-killing (antiseptic) solution that is used to clean the skin. Cleaning your skin with CHG may help to lower your risk for infection.  You may be given CHG to use for bathing. It may be in a bottle or in a prepackaged cloth to use on your skin. Carefully follow your health care provider's instructions and the instructions on the product label.  Do not use CHG if you have a chlorhexidine allergy.  Contact your health care provider if your skin gets irritated after scrubbing. This information is not intended to replace advice given to you by your health care provider. Make sure you discuss any questions you have with your health care provider. Document Revised: 08/16/2018 Document Reviewed: 04/27/2017 Elsevier Patient Education  2020 [employer].

## 2019-11-19 NOTE — Telephone Encounter (Signed)
She has moved it up

## 2019-11-19 NOTE — Telephone Encounter (Signed)
I have sent message to Carlos Robinson to see.

## 2019-11-20 ENCOUNTER — Encounter (HOSPITAL_COMMUNITY)
Admission: RE | Admit: 2019-11-20 | Discharge: 2019-11-20 | Disposition: A | Discharge status: Home / Self Care | Payer: BC Managed Care – PPO | Source: Ambulatory Visit | Attending: Orthopedic Surgery | Admitting: Orthopedic Surgery

## 2019-11-20 ENCOUNTER — Other Ambulatory Visit (HOSPITAL_COMMUNITY): Payer: BC Managed Care – PPO

## 2019-11-20 NOTE — Telephone Encounter (Signed)
I have left message for Eber Jones, to see if we can move case to next week. OR had time, but Dr Romeo Apple is scheduled to be in office.   We are putting it on Tuesday. I have apologized to patient told him I have made mistake with scheduling.

## 2019-11-25 ENCOUNTER — Other Ambulatory Visit (HOSPITAL_COMMUNITY)
Admission: RE | Admit: 2019-11-25 | Discharge: 2019-11-25 | Disposition: A | Discharge status: Home / Self Care | Payer: BC Managed Care – PPO | Source: Ambulatory Visit | Attending: Orthopedic Surgery | Admitting: Orthopedic Surgery

## 2019-11-25 ENCOUNTER — Encounter (HOSPITAL_COMMUNITY)
Admission: RE | Admit: 2019-11-25 | Discharge: 2019-11-25 | Disposition: A | Discharge status: Home / Self Care | Payer: BC Managed Care – PPO | Source: Ambulatory Visit | Attending: Orthopedic Surgery | Admitting: Orthopedic Surgery

## 2019-11-25 ENCOUNTER — Other Ambulatory Visit: Payer: Self-pay

## 2019-11-25 ENCOUNTER — Encounter (HOSPITAL_COMMUNITY): Payer: Self-pay

## 2019-11-25 DIAGNOSIS — Z01812 Encounter for preprocedural laboratory examination: Secondary | ICD-10-CM | POA: Insufficient documentation

## 2019-11-25 DIAGNOSIS — Z20822 Contact with and (suspected) exposure to covid-19: Secondary | ICD-10-CM | POA: Insufficient documentation

## 2019-11-25 LAB — CBC WITH DIFFERENTIAL/PLATELET
Abs Immature Granulocytes: 0.05 10*3/uL (ref 0.00–0.07)
Basophils Absolute: 0 10*3/uL (ref 0.0–0.1)
Basophils Relative: 0 %
Eosinophils Absolute: 0.3 10*3/uL (ref 0.0–0.5)
Eosinophils Relative: 3 %
HCT: 45.3 % (ref 39.0–52.0)
Hemoglobin: 14.1 g/dL (ref 13.0–17.0)
Immature Granulocytes: 1 %
Lymphocytes Relative: 25 %
Lymphs Abs: 2.5 10*3/uL (ref 0.7–4.0)
MCH: 22 pg — ABNORMAL LOW (ref 26.0–34.0)
MCHC: 31.1 g/dL (ref 30.0–36.0)
MCV: 70.7 fL — ABNORMAL LOW (ref 80.0–100.0)
Monocytes Absolute: 1 10*3/uL (ref 0.1–1.0)
Monocytes Relative: 10 %
Neutro Abs: 6.2 10*3/uL (ref 1.7–7.7)
Neutrophils Relative %: 61 %
Platelets: 252 10*3/uL (ref 150–400)
RBC: 6.41 MIL/uL — ABNORMAL HIGH (ref 4.22–5.81)
RDW: 17.4 % — ABNORMAL HIGH (ref 11.5–15.5)
WBC: 10 10*3/uL (ref 4.0–10.5)
nRBC: 0 % (ref 0.0–0.2)

## 2019-11-25 LAB — BASIC METABOLIC PANEL
Anion gap: 10 (ref 5–15)
BUN: 11 mg/dL (ref 6–20)
CO2: 24 mmol/L (ref 22–32)
Calcium: 9 mg/dL (ref 8.9–10.3)
Chloride: 103 mmol/L (ref 98–111)
Creatinine, Ser: 0.95 mg/dL (ref 0.61–1.24)
GFR calc Af Amer: >60 mL/min (ref >60)
GFR calc non Af Amer: >60 mL/min (ref >60)
Glucose, Bld: 98 mg/dL (ref 70–99)
Potassium: 4.2 mmol/L (ref 3.5–5.1)
Sodium: 137 mmol/L (ref 135–145)

## 2019-11-25 LAB — SARS CORONAVIRUS 2 (TAT 6-24 HRS): SARS Coronavirus 2: NEGATIVE

## 2019-11-26 ENCOUNTER — Ambulatory Visit (HOSPITAL_COMMUNITY): Payer: BC Managed Care – PPO | Admitting: Anesthesiology

## 2019-11-26 ENCOUNTER — Other Ambulatory Visit (HOSPITAL_COMMUNITY): Payer: BC Managed Care – PPO

## 2019-11-26 ENCOUNTER — Encounter (HOSPITAL_COMMUNITY)
Admission: RE | Disposition: A | Discharge status: Home / Self Care | Payer: Self-pay | Source: Home / Self Care | Attending: Orthopedic Surgery

## 2019-11-26 ENCOUNTER — Ambulatory Visit (HOSPITAL_COMMUNITY)
Admission: RE | Admit: 2019-11-26 | Discharge: 2019-11-26 | Disposition: A | Discharge status: Home / Self Care | Payer: BC Managed Care – PPO | Attending: Orthopedic Surgery | Admitting: Orthopedic Surgery

## 2019-11-26 ENCOUNTER — Telehealth: Payer: Self-pay | Admitting: Orthopedic Surgery

## 2019-11-26 ENCOUNTER — Encounter (HOSPITAL_COMMUNITY): Payer: Self-pay | Admitting: Orthopedic Surgery

## 2019-11-26 DIAGNOSIS — S86011A Strain of right Achilles tendon, initial encounter: Secondary | ICD-10-CM | POA: Insufficient documentation

## 2019-11-26 DIAGNOSIS — S86091A Other specified injury of right Achilles tendon, initial encounter: Secondary | ICD-10-CM | POA: Diagnosis not present

## 2019-11-26 DIAGNOSIS — S86011D Strain of right Achilles tendon, subsequent encounter: Secondary | ICD-10-CM

## 2019-11-26 DIAGNOSIS — F17200 Nicotine dependence, unspecified, uncomplicated: Secondary | ICD-10-CM | POA: Insufficient documentation

## 2019-11-26 DIAGNOSIS — Z7982 Long term (current) use of aspirin: Secondary | ICD-10-CM | POA: Insufficient documentation

## 2019-11-26 DIAGNOSIS — Y9367 Activity, basketball: Secondary | ICD-10-CM | POA: Insufficient documentation

## 2019-11-26 DIAGNOSIS — S86012A Strain of left Achilles tendon, initial encounter: Secondary | ICD-10-CM

## 2019-11-26 DIAGNOSIS — Z791 Long term (current) use of non-steroidal anti-inflammatories (NSAID): Secondary | ICD-10-CM | POA: Insufficient documentation

## 2019-11-26 DIAGNOSIS — W51XXXA Accidental striking against or bumped into by another person, initial encounter: Secondary | ICD-10-CM | POA: Insufficient documentation

## 2019-11-26 HISTORY — PX: ACHILLES TENDON SURGERY: SHX542

## 2019-11-26 SURGERY — REPAIR, TENDON, ACHILLES
Anesthesia: General | Site: Leg Lower | Laterality: Right

## 2019-11-26 MED ORDER — GLYCOPYRROLATE 0.2 MG/ML IJ SOLN
INTRAMUSCULAR | Status: DC | PRN
  Administered 2019-11-26: .2 mg via INTRAVENOUS

## 2019-11-26 MED ORDER — LIDOCAINE HCL (CARDIAC) PF 50 MG/5ML IV SOSY
PREFILLED_SYRINGE | INTRAVENOUS | Status: DC | PRN
  Administered 2019-11-26: 100 mg via INTRAVENOUS

## 2019-11-26 MED ORDER — BUPIVACAINE-EPINEPHRINE (PF) 0.5% -1:200000 IJ SOLN
INTRAMUSCULAR | Status: AC
  Filled 2019-11-26: qty 30

## 2019-11-26 MED ORDER — ROCURONIUM BROMIDE 100 MG/10ML IV SOLN
INTRAVENOUS | Status: DC | PRN
  Administered 2019-11-26: 40 mg via INTRAVENOUS

## 2019-11-26 MED ORDER — PROPOFOL 10 MG/ML IV BOLUS
INTRAVENOUS | Status: DC | PRN
  Administered 2019-11-26: 200 mg via INTRAVENOUS

## 2019-11-26 MED ORDER — HYDROCODONE-ACETAMINOPHEN 5-325 MG PO TABS: 1.0000 | ORAL_TABLET | ORAL | 0 refills | Status: AC | PRN

## 2019-11-26 MED ORDER — PHENYLEPHRINE HCL (PRESSORS) 10 MG/ML IV SOLN
INTRAVENOUS | Status: DC | PRN
Start: 2019-11-26 — End: 2019-11-26
  Administered 2019-11-26: 100 ug via INTRAVENOUS

## 2019-11-26 MED ORDER — PROPOFOL 10 MG/ML IV BOLUS
INTRAVENOUS | Status: AC
  Filled 2019-11-26: qty 20

## 2019-11-26 MED ORDER — BUPIVACAINE LIPOSOME 1.3 % IJ SUSP
INTRAMUSCULAR | Status: DC | PRN
  Administered 2019-11-26: 20 mL

## 2019-11-26 MED ORDER — FENTANYL CITRATE (PF) 100 MCG/2ML IJ SOLN
INTRAMUSCULAR | Status: AC
  Filled 2019-11-26: qty 2

## 2019-11-26 MED ORDER — SUCCINYLCHOLINE CHLORIDE 20 MG/ML IJ SOLN
INTRAMUSCULAR | Status: DC | PRN
  Administered 2019-11-26: 140 mg via INTRAVENOUS

## 2019-11-26 MED ORDER — ORAL CARE MOUTH RINSE: 15.0000 mL | Freq: Once | OROMUCOSAL | Status: AC

## 2019-11-26 MED ORDER — ONDANSETRON HCL 4 MG/2ML IJ SOLN: 4.0000 mg | Freq: Once | INTRAMUSCULAR | Status: DC | PRN

## 2019-11-26 MED ORDER — MIDAZOLAM HCL 2 MG/2ML IJ SOLN
INTRAMUSCULAR | Status: DC | PRN
  Administered 2019-11-26: 2 mg via INTRAVENOUS

## 2019-11-26 MED ORDER — BUPIVACAINE LIPOSOME 1.3 % IJ SUSP
INTRAMUSCULAR | Status: AC
  Filled 2019-11-26: qty 20

## 2019-11-26 MED ORDER — HYDROMORPHONE HCL 1 MG/ML IJ SOLN
INTRAMUSCULAR | Status: AC
  Filled 2019-11-26: qty 0.5

## 2019-11-26 MED ORDER — DEXAMETHASONE SODIUM PHOSPHATE 10 MG/ML IJ SOLN
INTRAMUSCULAR | Status: DC | PRN
  Administered 2019-11-26: 5 mg via INTRAVENOUS

## 2019-11-26 MED ORDER — ONDANSETRON HCL 4 MG/2ML IJ SOLN
INTRAMUSCULAR | Status: DC | PRN
  Administered 2019-11-26: 4 mg via INTRAVENOUS

## 2019-11-26 MED ORDER — SODIUM CHLORIDE 0.9 % IR SOLN
Status: DC | PRN
  Administered 2019-11-26: 1000 mL

## 2019-11-26 MED ORDER — CEFAZOLIN SODIUM-DEXTROSE 2-4 GM/100ML-% IV SOLN
2.0000 g | INTRAVENOUS | Status: AC
  Administered 2019-11-26: 2 g via INTRAVENOUS
  Filled 2019-11-26: qty 100

## 2019-11-26 MED ORDER — LACTATED RINGERS IV SOLN
INTRAVENOUS | Status: DC
  Administered 2019-11-26: 1000 mL via INTRAVENOUS

## 2019-11-26 MED ORDER — DEXMEDETOMIDINE HCL 200 MCG/2ML IV SOLN
INTRAVENOUS | Status: DC | PRN
  Administered 2019-11-26: 20 ug via INTRAVENOUS

## 2019-11-26 MED ORDER — CHLORHEXIDINE GLUCONATE 0.12 % MT SOLN
15.0000 mL | Freq: Once | OROMUCOSAL | Status: AC
  Administered 2019-11-26: 15 mL via OROMUCOSAL

## 2019-11-26 MED ORDER — ROCURONIUM BROMIDE 10 MG/ML (PF) SYRINGE
PREFILLED_SYRINGE | INTRAVENOUS | Status: AC
  Filled 2019-11-26: qty 10

## 2019-11-26 MED ORDER — CHLORHEXIDINE GLUCONATE 0.12 % MT SOLN
OROMUCOSAL | Status: AC
  Filled 2019-11-26: qty 15

## 2019-11-26 MED ORDER — ONDANSETRON HCL 4 MG/2ML IJ SOLN
INTRAMUSCULAR | Status: AC
  Filled 2019-11-26: qty 2

## 2019-11-26 MED ORDER — HYDROMORPHONE HCL 1 MG/ML IJ SOLN
0.5000 mg | INTRAMUSCULAR | Status: DC | PRN
  Administered 2019-11-26 (×3): 0.5 mg via INTRAVENOUS
  Filled 2019-11-26 (×2): qty 0.5

## 2019-11-26 MED ORDER — FENTANYL CITRATE (PF) 100 MCG/2ML IJ SOLN
INTRAMUSCULAR | Status: DC | PRN
  Administered 2019-11-26: 100 ug via INTRAVENOUS

## 2019-11-26 MED ORDER — PHENYLEPHRINE 40 MCG/ML (10ML) SYRINGE FOR IV PUSH (FOR BLOOD PRESSURE SUPPORT)
PREFILLED_SYRINGE | INTRAVENOUS | Status: AC
  Filled 2019-11-26: qty 10

## 2019-11-26 MED ORDER — GLYCOPYRROLATE PF 0.2 MG/ML IJ SOSY
PREFILLED_SYRINGE | INTRAMUSCULAR | Status: AC
  Filled 2019-11-26: qty 1

## 2019-11-26 MED ORDER — LIDOCAINE 2% (20 MG/ML) 5 ML SYRINGE
INTRAMUSCULAR | Status: AC
  Filled 2019-11-26: qty 5

## 2019-11-26 MED ORDER — SUCCINYLCHOLINE CHLORIDE 200 MG/10ML IV SOSY
PREFILLED_SYRINGE | INTRAVENOUS | Status: AC
  Filled 2019-11-26: qty 10

## 2019-11-26 MED ORDER — SUGAMMADEX SODIUM 500 MG/5ML IV SOLN
INTRAVENOUS | Status: DC | PRN
  Administered 2019-11-26: 200 mg via INTRAVENOUS

## 2019-11-26 MED ORDER — MIDAZOLAM HCL 2 MG/2ML IJ SOLN
INTRAMUSCULAR | Status: AC
  Filled 2019-11-26: qty 2

## 2019-11-26 SURGICAL SUPPLY — 54 items
APL PRP STRL LF DISP 70% ISPRP (MISCELLANEOUS) ×1
APL SKNCLS STERI-STRIP NONHPOA (GAUZE/BANDAGES/DRESSINGS) ×1
BANDAGE ELASTIC 4 VELCRO NS (GAUZE/BANDAGES/DRESSINGS) ×4 IMPLANT
BANDAGE ELASTIC 6 VELCRO NS (GAUZE/BANDAGES/DRESSINGS) ×2 IMPLANT
BANDAGE ESMARK 4X12 BL STRL LF (DISPOSABLE) ×1 IMPLANT
BENZOIN TINCTURE PRP APPL 2/3 (GAUZE/BANDAGES/DRESSINGS) ×2 IMPLANT
BNDG CMPR 12X4 ELC STRL LF (DISPOSABLE) ×1
BNDG COHESIVE 4X5 TAN STRL (GAUZE/BANDAGES/DRESSINGS) ×3 IMPLANT
BNDG ESMARK 4X12 BLUE STRL LF (DISPOSABLE) ×3
CHLORAPREP W/TINT 26 (MISCELLANEOUS) ×3 IMPLANT
CLOSURE WOUND 1/2 X4 (GAUZE/BANDAGES/DRESSINGS) ×1
CLOTH BEACON ORANGE TIMEOUT ST (SAFETY) ×3 IMPLANT
COVER LIGHT HANDLE STERIS (MISCELLANEOUS) ×6 IMPLANT
COVER WAND RF STERILE (DRAPES) ×3 IMPLANT
CUFF TOURN SGL QUICK 34 (TOURNIQUET CUFF) ×3
CUFF TRNQT CYL 34X4.125X (TOURNIQUET CUFF) ×1 IMPLANT
DRESSING XEROFORM 5X9 (GAUZE/BANDAGES/DRESSINGS) ×2 IMPLANT
GAUZE SPONGE 4X4 12PLY STRL (GAUZE/BANDAGES/DRESSINGS) ×2 IMPLANT
GAUZE XEROFORM 5X9 LF (GAUZE/BANDAGES/DRESSINGS) ×3 IMPLANT
GLOVE BIOGEL PI IND STRL 7.0 (GLOVE) ×2 IMPLANT
GLOVE BIOGEL PI INDICATOR 7.0 (GLOVE) ×6
GLOVE SKINSENSE NS SZ8.0 LF (GLOVE) ×2
GLOVE SKINSENSE STRL SZ8.0 LF (GLOVE) ×1 IMPLANT
GLOVE SS N UNI LF 8.5 STRL (GLOVE) ×3 IMPLANT
GOWN STRL REUS W/TWL LRG LVL3 (GOWN DISPOSABLE) ×6 IMPLANT
GOWN STRL REUS W/TWL XL LVL3 (GOWN DISPOSABLE) ×3 IMPLANT
INST SET MINOR BONE (KITS) ×3 IMPLANT
KIT TURNOVER KIT A (KITS) ×3 IMPLANT
MANIFOLD NEPTUNE II (INSTRUMENTS) ×3 IMPLANT
NDL HYPO 21X1.5 SAFETY (NEEDLE) ×1 IMPLANT
NEEDLE HYPO 21X1.5 SAFETY (NEEDLE) ×3 IMPLANT
NS IRRIG 1000ML POUR BTL (IV SOLUTION) ×3 IMPLANT
PACK BASIC LIMB (CUSTOM PROCEDURE TRAY) ×3 IMPLANT
PAD ARMBOARD 7.5X6 YLW CONV (MISCELLANEOUS) ×3 IMPLANT
PAD CAST 4YDX4 CTTN HI CHSV (CAST SUPPLIES) ×1 IMPLANT
PADDING CAST COTTON 4X4 STRL (CAST SUPPLIES) ×6
PADDING WEBRIL 6 STERILE (GAUZE/BANDAGES/DRESSINGS) ×2 IMPLANT
PENCIL SMOKE EVACUATOR (MISCELLANEOUS) ×2 IMPLANT
SET BASIN LINEN APH (SET/KITS/TRAYS/PACK) ×3 IMPLANT
SPLINT J IMMOBILIZER 4X20FT (CAST SUPPLIES) ×1 IMPLANT
SPLINT J PLASTER J 4INX20Y (CAST SUPPLIES) ×2
SPONGE LAP 18X18 RF (DISPOSABLE) ×3 IMPLANT
STAPLER VISISTAT 35W (STAPLE) IMPLANT
STRIP CLOSURE SKIN 1/2X4 (GAUZE/BANDAGES/DRESSINGS) ×1 IMPLANT
SUT ETHIBOND 5 LR DA (SUTURE) ×6 IMPLANT
SUT MON AB 0 CT1 (SUTURE) ×2 IMPLANT
SUT MON AB 2-0 CT1 36 (SUTURE) ×2 IMPLANT
SUT MON AB 2-0 SH 27 (SUTURE) ×3
SUT MON AB 2-0 SH27 (SUTURE) IMPLANT
SUT VIC AB 1 CT1 27 (SUTURE) ×3
SUT VIC AB 1 CT1 27XBRD ANTBC (SUTURE) IMPLANT
SYR 20ML LL LF (SYRINGE) ×2 IMPLANT
SYR 30ML LL (SYRINGE) ×2 IMPLANT
SYR BULB IRRIG 60ML STRL (SYRINGE) ×3 IMPLANT

## 2019-11-26 NOTE — Op Note (Signed)
11/26/2019  12:24 PM  PATIENT:  Carlos Robinson  49 y.o. male  PRE-OPERATIVE DIAGNOSIS:  rupture right achilles tendon  POST-OPERATIVE DIAGNOSIS:  rupture right achilles tendon  PROCEDURE:  Procedure(s): ACHILLES TENDON REPAIR (Right)   Findings ruptured Achilles tendon complete rupture attempts at healing with scar tissue noted the gap was approximately 3 cm with the foot in neutral position and with the foot plantar flexed maximally was about 5 mm  Assisted by Valetta Close  General anesthesia  SURGEON:  Surgeon(s) and Role:    Vickki Hearing, MD - Primary  Local exparel 20 cc injected  EBL:  5 mL    SPECIMEN:  No Specimen  DISPOSITION OF SPECIMEN:  N/A  COUNTS:  YES  TOURNIQUET:   Total Tourniquet Time Documented: Thigh (Right) - 43 minutes Total: Thigh (Right) - 43 minutes   DICTATION: .Reubin Milan Dictation  PLAN OF CARE: Discharge to home after PACU  PATIENT DISPOSITION:  PACU - hemodynamically stable.   Delay start of Pharmacological VTE agent (>24hrs) due to surgical blood loss or risk of bleeding: not applicable   Carlos Robinson was seen in preop his leg was checked and cleared for surgery.  Chart review updated Sater were completed leg was marked as the surgical site left ankle  Patient was taken to the operating for general anesthesia then placed in the prone position with appropriate padding  We placed a pillow and to blankets under his ankles as well.  After sterile prep and drape and timeout the incision was made just off the midline divided down through the subcutaneous tissue down to the peritenon which was divided and preserved.  The gap in the tendon was measured with the foot in plantarflexion and neutral position as stated above  The tendon was debrided and then blunt dissection was carried out proximally and distally to improve excursion and with the foot plantar flexed maximally the tendon edges came together easily  The sural nerve was per  detected after identification and we proceeded to place a #5 Ethibond suture with a modified Krakw stitch proximally and then 1 distally and then we tied both suture edges together with the foot man externally plantar flexed  We then placed a #1 Vicryl in circumferential fashion  We irrigated the wound closed the peritenon with 2-0 Monocryl and then did a subcuticular 2-0 Monocryl stitch applied benzoin and Steri-Strips.  20 cc Exparel injected.  A splint was placed over the dressing with the foot 5 degrees plantar flexed  The patient was placed in supine position and then extubated taken to recovery room in stable condition   Postop plan I would like to change his splint in a week so I can check the wound He will be nonweightbearing for 6 weeks we plan to cast him in the postop period or place of boot depending on the position of his foot. Like to get an articulated boot for him.

## 2019-11-26 NOTE — Discharge Instructions (Signed)
Post-op Plan:  I would like to change his splint in a week so I can check the wound Non-weight-bearing for 6 weeks, we plan to cast him in the post-op period or place a boot depending on the placement of foot Like to get an articulated boot for him

## 2019-11-26 NOTE — Transfer of Care (Signed)
Immediate Anesthesia Transfer of Care Note  Patient: Carlos Robinson  Procedure(s) Performed: ACHILLES TENDON REPAIR (Right Leg Lower)  Patient Location: PACU  Anesthesia Type:General  Level of Consciousness: awake, alert , oriented and patient cooperative  Airway & Oxygen Therapy: Patient Spontanous Breathing  Post-op Assessment: Report given to RN, Post -op Vital signs reviewed and stable and Patient moving all extremities  Post vital signs: Reviewed and stable  Last Vitals:  Vitals Value Taken Time  BP 120/79 11/26/19 1230  Temp    Pulse 72 11/26/19 1230  Resp 14 11/26/19 1230  SpO2 99 % 11/26/19 1230  Vitals shown include unvalidated device data.  Last Pain:  Vitals:   11/26/19 0835  TempSrc: Oral  PainSc: 0-No pain      Patients Stated Pain Goal: 9 (11/26/19 0835)  Complications: No complications documented.

## 2019-11-26 NOTE — Telephone Encounter (Signed)
Call from Conrad Clare, nurse, [facility], asking for discharge instructions, including medication orders. Ph# [phone].

## 2019-11-26 NOTE — Anesthesia Postprocedure Evaluation (Signed)
Anesthesia Post Note  Patient: Carlos Robinson  Procedure(s) Performed: ACHILLES TENDON REPAIR (Right Leg Lower)  Patient location during evaluation: PACU Anesthesia Type: General Level of consciousness: awake, oriented, awake and alert and patient cooperative Pain management: pain level controlled Vital Signs Assessment: post-procedure vital signs reviewed and stable Respiratory status: spontaneous breathing, respiratory function stable and nonlabored ventilation Cardiovascular status: blood pressure returned to baseline and stable Postop Assessment: no headache and no backache   No complications documented.   Last Vitals:  Vitals:   11/26/19 0835  BP: (!) 137/93  Resp: 12  Temp: 36.5 C  SpO2: 98%    Last Pain:  Vitals:   11/26/19 0835  TempSrc: Oral  PainSc: 0-No pain                 Brynda Peon

## 2019-11-26 NOTE — H&P (Signed)
  Patient ID: Carlos Robinson, male   DOB: 1971-05-01, 49 y.o.   MRN: [id number]   Emergency room record from (date) 5/29 has been reviewed and this is included by reference and includes the review of systems with the following addition:    Chief Complaint  Patient presents with  . Ankle Pain      R/hurt it playing basketball 10/12/19/better but still having issues      HPI Carlos Robinson is a 49 y.o. male.  Right foot/ankle injury   HPI 49 year old male playing basketball back on May 1 ran past another player felt like he was kicked in the back of the ankle was unable to continue playing in the game.  He has been limping for a period of about 5 weeks with associated swelling of the right foot and ankle and pain in the Achilles   Notes previous strain of the Achilles Review of Systems Review of Systems  All other systems reviewed and are negative.      has no past medical history on file.         Past Surgical History:  Procedure Laterality Date  . cyst removed from jaw               Family History  Problem Relation Age of Onset  . Cancer Mother        Social History Social History        Tobacco Use  . Smoking status: Current Every Day Smoker  . Smokeless tobacco: Never Used  Substance Use Topics  . Alcohol use: No  . Drug use: No      No Known Allergies         Current Outpatient Medications  Medication Sig Dispense Refill  . Aspirin-Acetaminophen-Caffeine (GOODY HEADACHE PO) Take 1 packet by mouth as needed. FOR PAIN       . meloxicam (MOBIC) 15 MG tablet Take 1 tablet (15 mg total) by mouth daily. 30 tablet 0  . Green Tea, Camillia sinensis, (GREEN TEA PO) Take 1 tablet by mouth daily.          No current facility-administered medications for this visit.      Physical Exam BP 113/74   Pulse 77   Ht 5\' 7"  (1.702 m)   Wt 181 lb (82.1 kg)   BMI 28.35 kg/m  Body mass index is 28.35 kg/m.   Well developed and well nourished  Abnormal gait limping left  leg Alert and oriented x 3  Normal affect and mood   Ortho Exam   Palpable defect in the Achilles 2 cm from the insertion positive Thompson test for Achilles tendon rupture    Normal ankle range of motion    Plantarflexion strength    Able ankle joint      Normal skin and neurovascular exam    data Reviewed IMAGING From THE ER AND THE REPORT ARE REVIEWED, MY INTERPRETATION OF THE IMAGE(S) IS :  Soft tissue swelling no fracture no bone fragments     Assessment   Subacute Achilles tendon rupture   Plan     Recommend repair Achilles right ankle   The procedure has been fully reviewed with the patient; The risks and benefits of surgery have been discussed and explained and understood. Alternative treatment has also been reviewed, questions were encouraged and answered. The postoperative plan is also been reviewed.       , MD 11/18/2019 10:48 AM

## 2019-11-26 NOTE — Brief Op Note (Signed)
11/26/2019  12:24 PM  PATIENT:  Carlos Robinson  49 y.o. male  PRE-OPERATIVE DIAGNOSIS:  rupture right achilles tendon  POST-OPERATIVE DIAGNOSIS:  rupture right achilles tendon  PROCEDURE:  Procedure(s): ACHILLES TENDON REPAIR (Right)   Findings ruptured Achilles tendon complete rupture attempts at healing with scar tissue noted the gap was approximately 3 cm with the foot in neutral position and with the foot plantar flexed maximally was about 5 mm  Assisted by Debbie Dallas  General anesthesia  SURGEON:  Surgeon(s) and Role:    * Ryen Heitmeyer E, MD - Primary  Local exparel 20 cc injected  EBL:  5 mL    SPECIMEN:  No Specimen  DISPOSITION OF SPECIMEN:  N/A  COUNTS:  YES  TOURNIQUET:   Total Tourniquet Time Documented: Thigh (Right) - 43 minutes Total: Thigh (Right) - 43 minutes   DICTATION: .Dragon Dictation  PLAN OF CARE: Discharge to home after PACU  PATIENT DISPOSITION:  PACU - hemodynamically stable.   Delay start of Pharmacological VTE agent (>24hrs) due to surgical blood loss or risk of bleeding: not applicable   [name] was seen in preop his leg was checked and cleared for surgery.  Chart review updated Sater were completed leg was marked as the surgical site left ankle  Patient was taken to the operating for general anesthesia then placed in the prone position with appropriate padding  We placed a pillow and to blankets under his ankles as well.  After sterile prep and drape and timeout the incision was made just off the midline divided down through the subcutaneous tissue down to the peritenon which was divided and preserved.  The gap in the tendon was measured with the foot in plantarflexion and neutral position as stated above  The tendon was debrided and then blunt dissection was carried out proximally and distally to improve excursion and with the foot plantar flexed maximally the tendon edges came together easily  The sural nerve was per  detected after identification and we proceeded to place a #5 Ethibond suture with a modified Krakw stitch proximally and then 1 distally and then we tied both suture edges together with the foot man externally plantar flexed  We then placed a #1 Vicryl in circumferential fashion  We irrigated the wound closed the peritenon with 2-0 Monocryl and then did a subcuticular 2-0 Monocryl stitch applied benzoin and Steri-Strips.  20 cc Exparel injected.  A splint was placed over the dressing with the foot 5 degrees plantar flexed  The patient was placed in supine position and then extubated taken to recovery room in stable condition   Postop plan I would like to change his splint in a week so I can check the wound He will be nonweightbearing for 6 weeks we plan to cast him in the postop period or place of boot depending on the position of his foot. Like to get an articulated boot for him.     

## 2019-11-26 NOTE — Anesthesia Procedure Notes (Signed)
Procedure Name: Intubation °Performed by: Harless Molinari L, CRNA °Pre-anesthesia Checklist: Patient identified, Emergency Drugs available, Suction available, Patient being monitored and Timeout performed °Patient Re-evaluated:Patient Re-evaluated prior to induction °Oxygen Delivery Method: Circle system utilized °Preoxygenation: Pre-oxygenation with 100% oxygen °Induction Type: IV induction °Laryngoscope Size: Miller and 2 °Grade View: Grade I °Tube type: Oral °Tube size: 8.0 mm °Number of attempts: 1 °Airway Equipment and Method: Stylet °Placement Confirmation: ETT inserted through vocal cords under direct vision,  positive ETCO2,  CO2 detector and breath sounds checked- equal and bilateral °Secured at: 23 cm °Tube secured with: Tape °Dental Injury: Teeth and Oropharynx as per pre-operative assessment  ° ° ° ° ° ° °

## 2019-11-26 NOTE — Anesthesia Preprocedure Evaluation (Signed)
Anesthesia Evaluation  Patient identified by MRN, date of birth, ID band Patient awake    Reviewed: Allergy & Precautions, H&P , NPO status , Patient's Chart, lab work & pertinent test results, reviewed documented beta blocker date and time   Airway Mallampati: II  TM Distance: >3 FB Neck ROM: full    Dental no notable dental hx. (+) Teeth Intact   Pulmonary neg pulmonary ROS, Current Smoker and Patient abstained from smoking.,    Pulmonary exam normal breath sounds clear to auscultation       Cardiovascular Exercise Tolerance: Good negative cardio ROS   Rhythm:regular Rate:Normal     Neuro/Psych negative neurological ROS  negative psych ROS   GI/Hepatic negative GI ROS, Neg liver ROS,   Endo/Other  negative endocrine ROS  Renal/GU negative Renal ROS  negative genitourinary   Musculoskeletal   Abdominal   Peds  Hematology negative hematology ROS (+)   Anesthesia Other Findings   Reproductive/Obstetrics negative OB ROS                             Anesthesia Physical Anesthesia Plan  ASA: II  Anesthesia Plan: General   Post-op Pain Management:    Induction:   PONV Risk Score and Plan:   Airway Management Planned:   Additional Equipment:   Intra-op Plan:   Post-operative Plan:   Informed Consent: I have reviewed the patients History and Physical, chart, labs and discussed the procedure including the risks, benefits and alternatives for the proposed anesthesia with the patient or authorized representative who has indicated his/her understanding and acceptance.     Dental Advisory Given  Plan Discussed with: CRNA  Anesthesia Plan Comments:         Anesthesia Quick Evaluation

## 2019-11-27 ENCOUNTER — Encounter (HOSPITAL_COMMUNITY): Payer: Self-pay | Admitting: Orthopedic Surgery

## 2019-11-28 ENCOUNTER — Telehealth: Payer: Self-pay | Admitting: Orthopedic Surgery

## 2019-11-28 NOTE — Telephone Encounter (Signed)
Done

## 2019-11-28 NOTE — Telephone Encounter (Signed)
Called patient regarding forms received in fax from his short-term disability insurer. Relayed forms process (authorization form and fee to be done through our forms provider [employer]). Patient to come to office this afternoon with fee and to complete release form.

## 2019-12-04 DIAGNOSIS — S86011A Strain of right Achilles tendon, initial encounter: Secondary | ICD-10-CM | POA: Insufficient documentation

## 2019-12-06 ENCOUNTER — Ambulatory Visit (INDEPENDENT_AMBULATORY_CARE_PROVIDER_SITE_OTHER): Payer: BC Managed Care – PPO | Admitting: Orthopedic Surgery

## 2019-12-06 ENCOUNTER — Other Ambulatory Visit: Payer: Self-pay

## 2019-12-06 ENCOUNTER — Encounter: Payer: Self-pay | Admitting: Orthopedic Surgery

## 2019-12-06 DIAGNOSIS — S86011A Strain of right Achilles tendon, initial encounter: Secondary | ICD-10-CM | POA: Diagnosis not present

## 2019-12-06 MED ORDER — HYDROCODONE-ACETAMINOPHEN 5-325 MG PO TABS: 1.0000 | ORAL_TABLET | Freq: Three times a day (TID) | ORAL | 0 refills | Status: AC | PRN

## 2019-12-06 NOTE — Progress Notes (Signed)
Chief Complaint  Patient presents with  . Post-op Follow-up    Achilles repair right 11/26/19     Pod 10   Achilles tendon repair right ankle  Wound is great   Tendon feels intact     Meds ordered this encounter  Medications  . HYDROcodone-acetaminophen (NORCO/VICODIN) 5-325 MG tablet    Sig: Take 1 tablet by mouth every 8 (eight) hours as needed for up to 7 days for moderate pain.    Dispense:  21 tablet    Refill:  0    Ankle rom brace 0-5 PF , NWB   Fu 2 weeks

## 2019-12-10 ENCOUNTER — Telehealth: Payer: Self-pay | Admitting: Orthopedic Surgery

## 2019-12-10 NOTE — Telephone Encounter (Signed)
I called him, only if it swells / he voiced understanding.

## 2019-12-10 NOTE — Telephone Encounter (Signed)
Call/voice message received from patient; asking if he is still to elevate his foot? Please advise.

## 2019-12-20 ENCOUNTER — Ambulatory Visit: Payer: BC Managed Care – PPO | Admitting: Orthopedic Surgery

## 2019-12-23 ENCOUNTER — Encounter: Payer: Self-pay | Admitting: Orthopedic Surgery

## 2019-12-23 ENCOUNTER — Ambulatory Visit (INDEPENDENT_AMBULATORY_CARE_PROVIDER_SITE_OTHER): Payer: BC Managed Care – PPO | Admitting: Orthopedic Surgery

## 2019-12-23 ENCOUNTER — Other Ambulatory Visit: Payer: Self-pay

## 2019-12-23 VITALS — Ht 67.0 in | Wt 181.0 lb

## 2019-12-23 DIAGNOSIS — S86011A Strain of right Achilles tendon, initial encounter: Secondary | ICD-10-CM

## 2019-12-23 DIAGNOSIS — G8918 Other acute postprocedural pain: Secondary | ICD-10-CM

## 2019-12-23 MED ORDER — HYDROCODONE-ACETAMINOPHEN 5-325 MG PO TABS: 1.0000 | ORAL_TABLET | Freq: Three times a day (TID) | ORAL | 0 refills | Status: AC | PRN

## 2019-12-23 NOTE — Progress Notes (Signed)
Chief Complaint  Patient presents with  . Routine Post Op    Rt achilles rupture. DOS 11/26/19  . Medication Refill    Hydrocodone   Postop day 27 status post repair of right Achilles tendon rupture.  Patient is in a cam walker at 0 to 5 degrees plantarflexion advanced to 20 degrees today.  He still nonweightbearing  His wound was clean  Come back in 2 weeks to advance the brace again and start weightbearing in the boot

## 2020-01-06 ENCOUNTER — Encounter: Payer: Self-pay | Admitting: Orthopedic Surgery

## 2020-01-06 ENCOUNTER — Ambulatory Visit (INDEPENDENT_AMBULATORY_CARE_PROVIDER_SITE_OTHER): Payer: BC Managed Care – PPO | Admitting: Orthopedic Surgery

## 2020-01-06 ENCOUNTER — Other Ambulatory Visit: Payer: Self-pay

## 2020-01-06 DIAGNOSIS — Z9889 Other specified postprocedural states: Secondary | ICD-10-CM

## 2020-01-06 MED ORDER — IBUPROFEN 800 MG PO TABS: 800.0000 mg | ORAL_TABLET | Freq: Three times a day (TID) | ORAL | 1 refills | Status: DC | PRN

## 2020-01-06 MED ORDER — HYDROCODONE-ACETAMINOPHEN 5-325 MG PO TABS: 1.0000 | ORAL_TABLET | Freq: Three times a day (TID) | ORAL | 0 refills | Status: AC | PRN

## 2020-01-06 NOTE — Progress Notes (Signed)
Chief Complaint  Patient presents with   Routine Post Op    11/26/19 ACHILLES RUPTURE REPAIR    6 weeks post op  right Achilles tendon rupture  Repair looks good Thompson test looks good palpable tendon in continuity  Start weightbearing as tolerated with brace set at 0 degrees dorsiflexion stop 20 degree range of motion   Return in 3 weeks  Meds ordered this encounter  Medications   HYDROcodone-acetaminophen (NORCO/VICODIN) 5-325 MG tablet    Sig: Take 1 tablet by mouth every 8 (eight) hours as needed for moderate pain.    Dispense:  21 tablet    Refill:  0   ibuprofen (ADVIL) 800 MG tablet    Sig: Take 1 tablet (800 mg total) by mouth every 8 (eight) hours as needed.    Dispense:  90 tablet    Refill:  1

## 2020-01-06 NOTE — Patient Instructions (Signed)
Weight bearing as tolerated in the brace

## 2020-01-08 ENCOUNTER — Ambulatory Visit: Payer: BC Managed Care – PPO | Admitting: Orthopedic Surgery

## 2020-01-14 ENCOUNTER — Other Ambulatory Visit: Payer: Self-pay | Admitting: Orthopedic Surgery

## 2020-01-14 MED ORDER — MELOXICAM 15 MG PO TABS: 15.0000 mg | ORAL_TABLET | Freq: Every day | ORAL | 0 refills | Status: DC

## 2020-01-14 NOTE — Telephone Encounter (Signed)
Patient requests refil meloxicam (MOBIC) 15 MG tablet 30 tablet  -[facility], [address], [place]:

## 2020-01-27 ENCOUNTER — Ambulatory Visit (INDEPENDENT_AMBULATORY_CARE_PROVIDER_SITE_OTHER): Payer: BC Managed Care – PPO | Admitting: Orthopedic Surgery

## 2020-01-27 ENCOUNTER — Other Ambulatory Visit: Payer: Self-pay

## 2020-01-27 ENCOUNTER — Encounter: Payer: Self-pay | Admitting: Orthopedic Surgery

## 2020-01-27 VITALS — BP 153/94 | HR 75 | Ht 67.0 in

## 2020-01-27 DIAGNOSIS — S86011D Strain of right Achilles tendon, subsequent encounter: Secondary | ICD-10-CM

## 2020-01-27 NOTE — Progress Notes (Signed)
Chief Complaint  Patient presents with  . Foot Pain    S/p achilles repair 11/26/19, uses boot when he is walking it helps some .    Postop day number 62-week 9 Achilles tendon repair 3 weeks weightbearing in a cam walker with a dorsiflexion stop at 0 allowing 20 degrees of plantarflexion  Wound is fine.  Tendon is palpable throughout its insertion.  Ankle dorsiflexion plantarflexion is excellent  Janee Morn test has returned to normal  Patient is ambulatory in a cam walker continue for 3 weeks and then come back a week 12 should be able to return to work after that.  Encounter Diagnosis  Name Primary?  . Achilles tendon rupture, right, subsequent encounter s/p repair 11/26/19  Yes

## 2020-02-11 ENCOUNTER — Other Ambulatory Visit: Payer: Self-pay | Admitting: Orthopedic Surgery

## 2020-02-13 ENCOUNTER — Ambulatory Visit (INDEPENDENT_AMBULATORY_CARE_PROVIDER_SITE_OTHER): Payer: BC Managed Care – PPO | Admitting: Orthopedic Surgery

## 2020-02-13 ENCOUNTER — Encounter: Payer: Self-pay | Admitting: Orthopedic Surgery

## 2020-02-13 ENCOUNTER — Other Ambulatory Visit: Payer: Self-pay

## 2020-02-13 VITALS — BP 131/83 | HR 63 | Ht 67.0 in | Wt 195.0 lb

## 2020-02-13 DIAGNOSIS — S86011A Strain of right Achilles tendon, initial encounter: Secondary | ICD-10-CM

## 2020-02-13 NOTE — Patient Instructions (Addendum)
Return to work, date of his choice   Start double leg raises and stretching

## 2020-02-13 NOTE — Progress Notes (Signed)
Chief Complaint  Patient presents with  . Ankle Pain    achiles tendon rupture 11/26/19. patient reprots doing good today.   Mr. Carlos Robinson is doing well he is walking without any brace or support his Janee Morn test is normal his wound healed nicely he is got good range of motion  Return to work, date of his choice   Start double leg raises and stretching  He has an optional follow-up for 6 weeks

## 2020-02-24 ENCOUNTER — Other Ambulatory Visit: Payer: Self-pay | Admitting: Orthopedic Surgery

## 2020-02-24 DIAGNOSIS — Z9889 Other specified postprocedural states: Secondary | ICD-10-CM

## 2020-02-24 NOTE — Telephone Encounter (Signed)
Patient came to office, requests refill: HYDROcodone-acetaminophen (NORCO/VICODIN) 5-325 MG tablet 21 tablet  -[facility] on [address]

## 2020-02-25 ENCOUNTER — Other Ambulatory Visit: Payer: Self-pay | Admitting: Orthopedic Surgery

## 2020-02-25 NOTE — Telephone Encounter (Signed)
Left message, he should be on Meloxicam, told him if he needs it refilled to let us know and call back

## 2020-02-25 NOTE — Telephone Encounter (Signed)
Patient called back as per last note - requests refill on: meloxicam (MOBIC) 15 MG tablet 30 tablet  -[facility], [address], [place]

## 2020-02-25 NOTE — Telephone Encounter (Signed)
Can you call him back   And tell him I recommend ibuprofen at this time to prevent conytnuous use of opioids

## 2020-02-26 MED ORDER — MELOXICAM 15 MG PO TABS: ORAL_TABLET | ORAL | 2 refills | Status: DC

## 2020-03-17 ENCOUNTER — Telehealth: Payer: Self-pay | Admitting: Orthopedic Surgery

## 2020-03-17 NOTE — Telephone Encounter (Signed)
Carlos Robinson called today and canceled his appointment for the 14th.  He states that he was doing fine, he has healed well and doesn't  think he needs to follow up here any longer.

## 2020-03-26 ENCOUNTER — Ambulatory Visit: Payer: BC Managed Care – PPO | Admitting: Orthopedic Surgery

## 2020-07-23 ENCOUNTER — Other Ambulatory Visit: Payer: Self-pay | Admitting: Orthopedic Surgery

## 2020-07-23 DIAGNOSIS — Z9889 Other specified postprocedural states: Secondary | ICD-10-CM

## 2020-07-23 NOTE — Telephone Encounter (Signed)
Rx request 

## 2020-08-24 ENCOUNTER — Other Ambulatory Visit: Payer: Self-pay | Admitting: Orthopedic Surgery

## 2021-03-16 DIAGNOSIS — M7661 Achilles tendinitis, right leg: Secondary | ICD-10-CM | POA: Diagnosis not present

## 2021-03-16 DIAGNOSIS — R194 Change in bowel habit: Secondary | ICD-10-CM | POA: Diagnosis not present

## 2021-03-16 DIAGNOSIS — R52 Pain, unspecified: Secondary | ICD-10-CM | POA: Diagnosis not present

## 2021-03-16 DIAGNOSIS — Z1211 Encounter for screening for malignant neoplasm of colon: Secondary | ICD-10-CM | POA: Diagnosis not present

## 2021-03-23 ENCOUNTER — Encounter: Payer: Self-pay | Admitting: Gastroenterology

## 2021-04-16 DIAGNOSIS — Z0000 Encounter for general adult medical examination without abnormal findings: Secondary | ICD-10-CM | POA: Diagnosis not present

## 2021-04-16 DIAGNOSIS — Z125 Encounter for screening for malignant neoplasm of prostate: Secondary | ICD-10-CM | POA: Diagnosis not present

## 2021-04-22 DIAGNOSIS — Z0001 Encounter for general adult medical examination with abnormal findings: Secondary | ICD-10-CM | POA: Diagnosis not present

## 2021-04-22 DIAGNOSIS — H6123 Impacted cerumen, bilateral: Secondary | ICD-10-CM | POA: Diagnosis not present

## 2021-08-10 ENCOUNTER — Ambulatory Visit: Payer: BC Managed Care – PPO | Admitting: Gastroenterology

## 2021-08-10 ENCOUNTER — Encounter: Payer: Self-pay | Admitting: Gastroenterology

## 2022-01-03 IMAGING — DX DG ANKLE COMPLETE 3+V*R*
3 series · 3 of 3 positions shown · non-contrast
Comparison: None.

CLINICAL DATA: Ankle injury 1 month ago, initial encounter

EXAM:
RIGHT ANKLE - COMPLETE 3+ VIEW

[ankle ap]
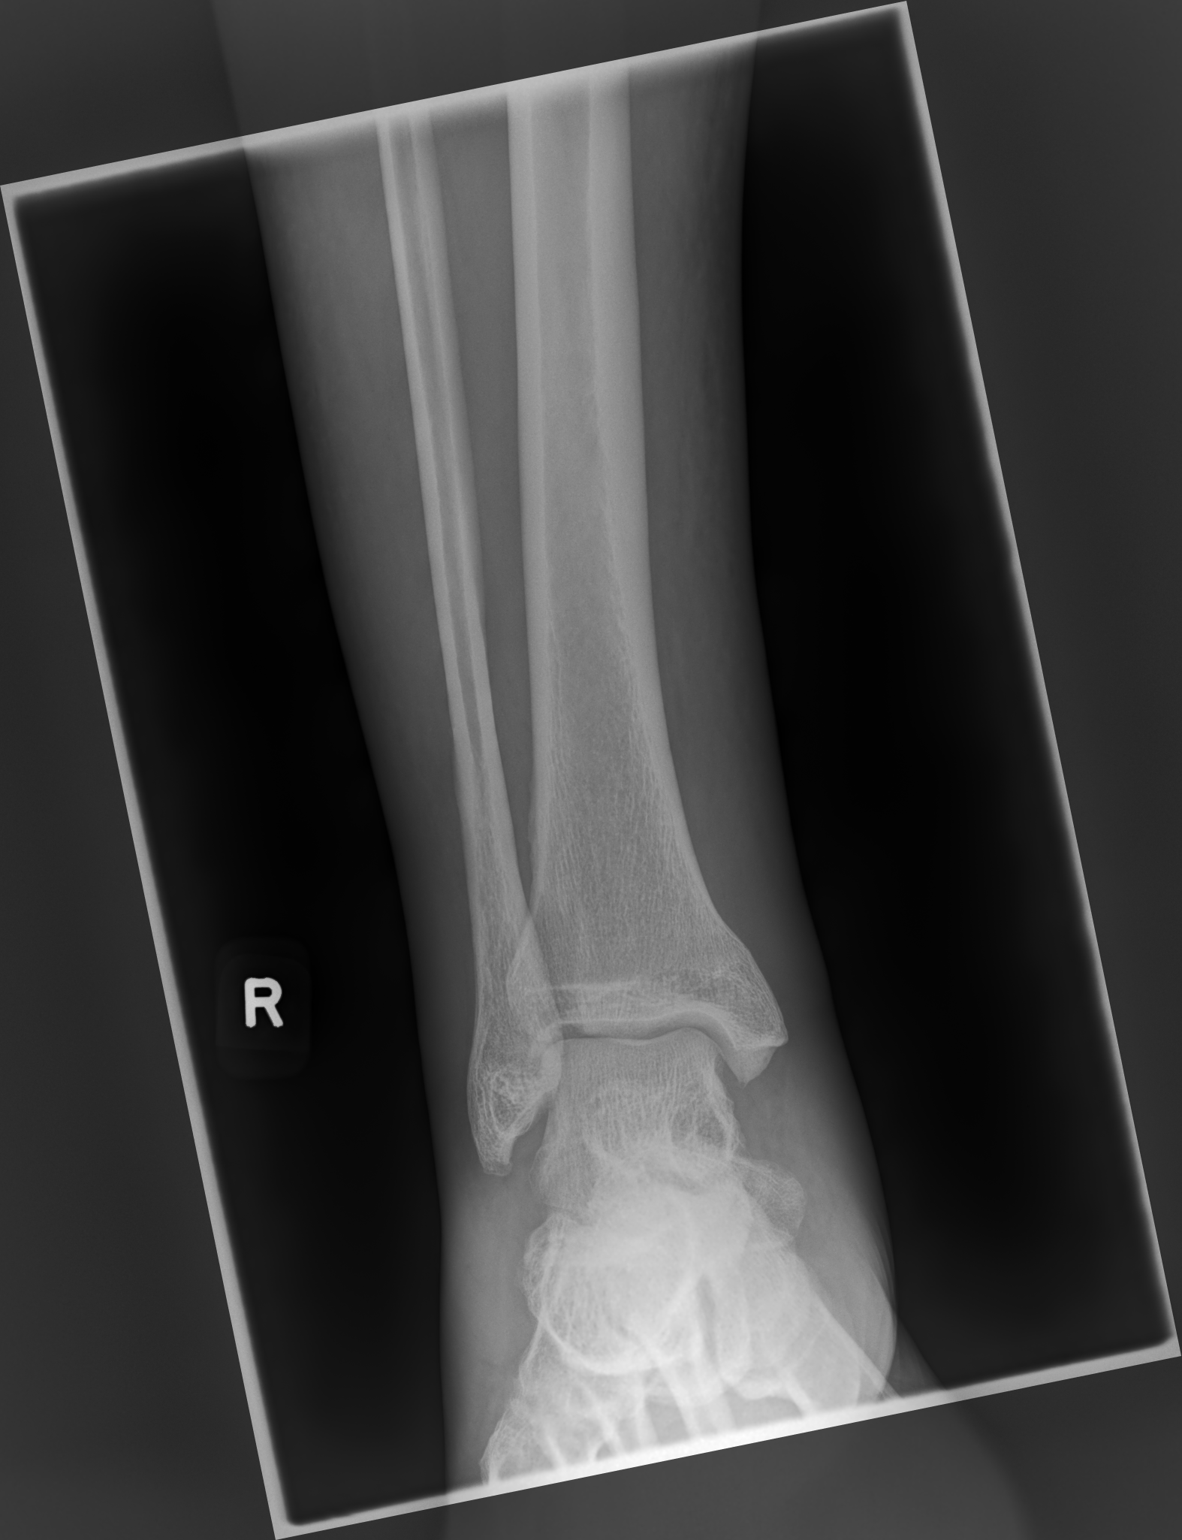

[ankle mlo]
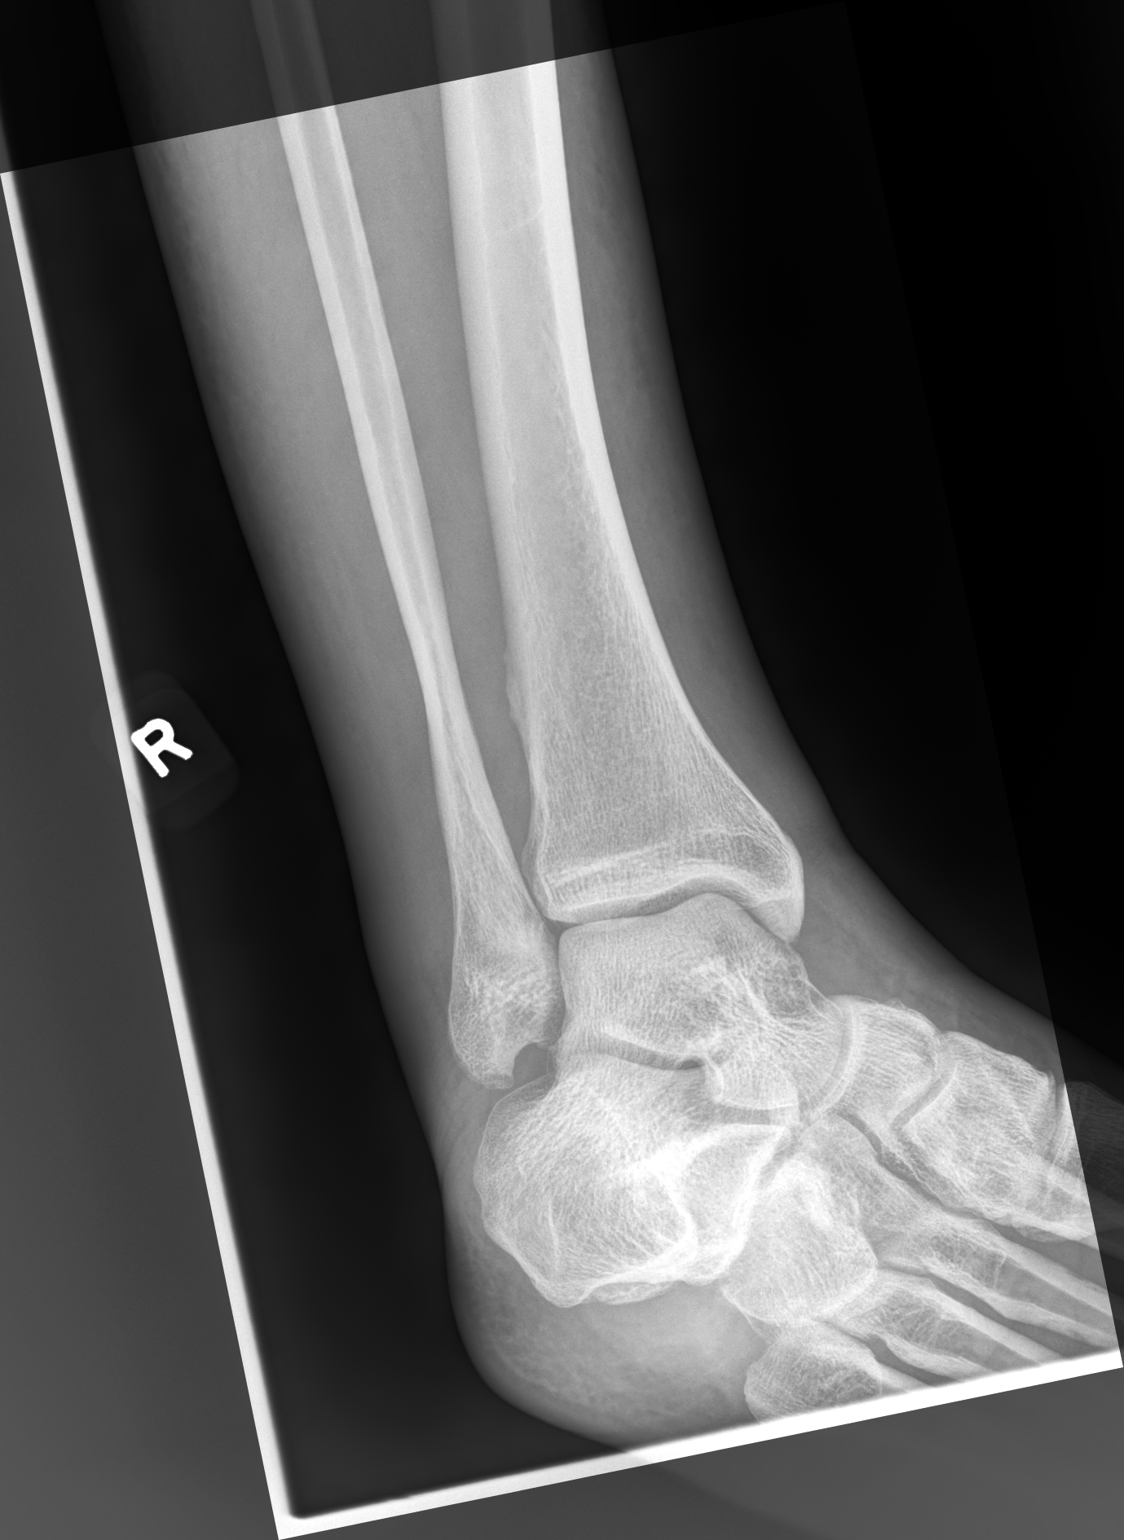

[ankle lat]
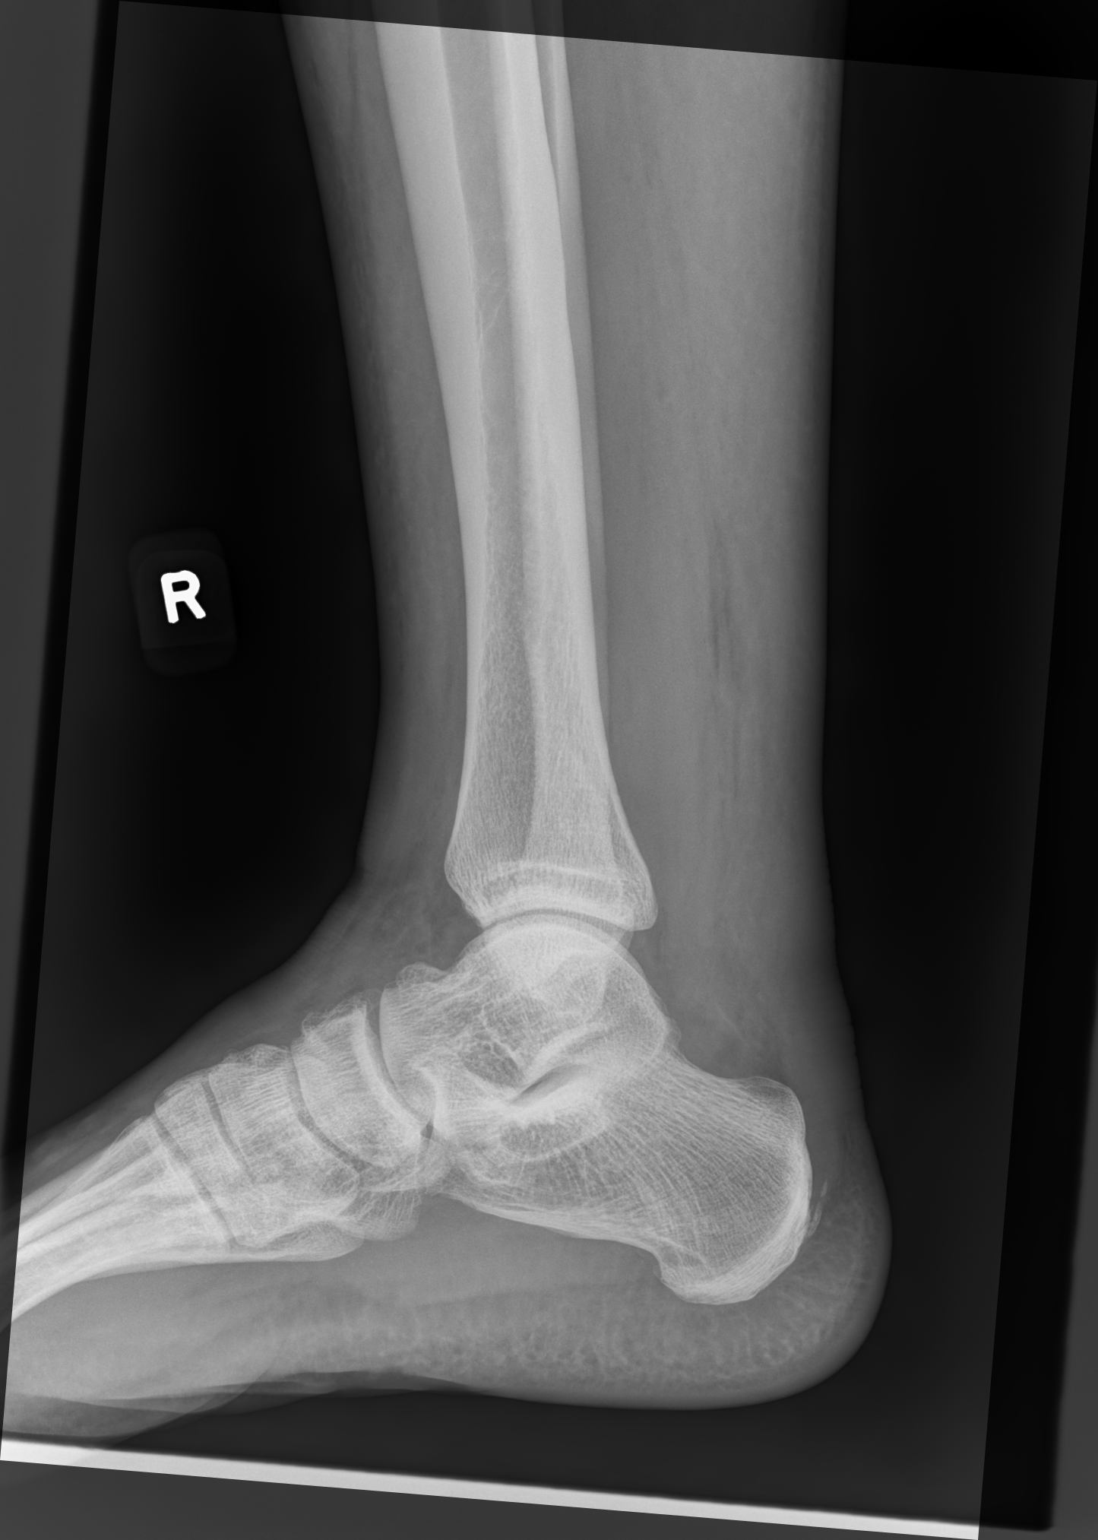

[3 of 3 positions shown; findings below may reference images not displayed]

FINDINGS: Soft tissue swelling is noted about the ankle joint. No acute
fracture or dislocation is noted. Tarsal degenerative changes are
seen. Mild irregularity and soft tissue swelling is noted along the
Achilles tendon. Correlate with clinical exam.
IMPRESSION: Soft tissue swelling with irregularity along the Achilles tendon.
This may represent a partial Achilles tear. No fracture is seen.
